# Patient Record
Sex: Male | Born: 1965 | Race: Black or African American | Hispanic: No | Marital: Single | Smoking: Never smoker
Health system: Southern US, Community
[De-identification: ages and names within clinical notes are randomized; demographics above are authoritative.]

## PROBLEM LIST (undated history)

## (undated) DIAGNOSIS — J45909 Unspecified asthma, uncomplicated: Secondary | ICD-10-CM

## (undated) DIAGNOSIS — I10 Essential (primary) hypertension: Secondary | ICD-10-CM

---

## 2004-04-09 ENCOUNTER — Emergency Department: Payer: Self-pay | Admitting: Emergency Medicine

## 2005-01-15 ENCOUNTER — Emergency Department: Payer: Self-pay | Admitting: Emergency Medicine

## 2005-03-25 ENCOUNTER — Other Ambulatory Visit: Payer: Self-pay

## 2005-03-25 ENCOUNTER — Emergency Department: Payer: Self-pay | Admitting: Emergency Medicine

## 2005-09-25 ENCOUNTER — Emergency Department: Payer: Self-pay | Admitting: Emergency Medicine

## 2006-10-12 ENCOUNTER — Emergency Department: Payer: Self-pay | Admitting: Emergency Medicine

## 2007-11-05 ENCOUNTER — Emergency Department: Payer: Self-pay | Admitting: Emergency Medicine

## 2008-03-16 ENCOUNTER — Emergency Department: Payer: Self-pay | Admitting: Emergency Medicine

## 2009-10-13 ENCOUNTER — Emergency Department: Payer: Self-pay | Admitting: Emergency Medicine

## 2011-04-23 ENCOUNTER — Emergency Department: Payer: Self-pay | Admitting: *Deleted

## 2011-07-15 ENCOUNTER — Emergency Department: Payer: Self-pay | Admitting: Emergency Medicine

## 2011-07-15 LAB — CK TOTAL AND CKMB (NOT AT ARMC)
CK, Total: 213 U/L (ref 35–232)
CK, Total: 190 U/L (ref 35–232)
CK-MB: 1.9 ng/mL (ref 0.5–3.6)

## 2011-07-15 LAB — CBC
HGB: 14.5 g/dL (ref 13.0–18.0)
MCH: 30.5 pg (ref 26.0–34.0)
MCHC: 34.1 g/dL (ref 32.0–36.0)
Platelet: 158 10*3/uL (ref 150–440)
RDW: 13.5 % (ref 11.5–14.5)

## 2011-07-15 LAB — COMPREHENSIVE METABOLIC PANEL
Alkaline Phosphatase: 52 U/L (ref 50–136)
Bilirubin,Total: 0.4 mg/dL (ref 0.2–1.0)
Chloride: 106 mmol/L (ref 98–107)
Co2: 28 mmol/L (ref 21–32)
Creatinine: 1.21 mg/dL (ref 0.60–1.30)
EGFR (African American): >60
EGFR (Non-African Amer.): >60
Glucose: 92 mg/dL (ref 65–99)
Osmolality: 288 (ref 275–301)
Sodium: 144 mmol/L (ref 136–145)
Total Protein: 7.3 g/dL (ref 6.4–8.2)

## 2011-09-03 ENCOUNTER — Emergency Department: Payer: Self-pay | Admitting: Unknown Physician Specialty

## 2011-09-03 LAB — TROPONIN I: Troponin-I: <0.02 ng/mL

## 2011-09-03 LAB — CBC
HGB: 14.9 g/dL (ref 13.0–18.0)
MCH: 30 pg (ref 26.0–34.0)
MCV: 90 fL (ref 80–100)
Platelet: 143 10*3/uL — ABNORMAL LOW (ref 150–440)
RBC: 4.96 10*6/uL (ref 4.40–5.90)
RDW: 13.5 % (ref 11.5–14.5)
WBC: 8.1 10*3/uL (ref 3.8–10.6)

## 2011-09-03 LAB — COMPREHENSIVE METABOLIC PANEL
Albumin: 3.8 g/dL (ref 3.4–5.0)
Alkaline Phosphatase: 55 U/L (ref 50–136)
Calcium, Total: 8.7 mg/dL (ref 8.5–10.1)
Co2: 31 mmol/L (ref 21–32)
EGFR (Non-African Amer.): >60
Glucose: 81 mg/dL (ref 65–99)
Potassium: 3.6 mmol/L (ref 3.5–5.1)
SGOT(AST): 29 U/L (ref 15–37)
Total Protein: 7.7 g/dL (ref 6.4–8.2)

## 2012-01-11 ENCOUNTER — Ambulatory Visit: Payer: Self-pay | Admitting: Family Medicine

## 2012-01-15 ENCOUNTER — Ambulatory Visit: Payer: Self-pay | Admitting: Emergency Medicine

## 2012-01-23 ENCOUNTER — Encounter: Payer: Self-pay | Admitting: Emergency Medicine

## 2012-01-25 ENCOUNTER — Ambulatory Visit: Payer: Self-pay

## 2012-02-01 ENCOUNTER — Encounter: Payer: Self-pay | Admitting: Emergency Medicine

## 2012-03-28 ENCOUNTER — Emergency Department: Payer: Self-pay | Admitting: Emergency Medicine

## 2012-12-06 ENCOUNTER — Emergency Department: Payer: Self-pay | Admitting: Emergency Medicine

## 2012-12-06 LAB — CBC
HGB: 14.5 g/dL (ref 13.0–18.0)
MCH: 30.3 pg (ref 26.0–34.0)
MCHC: 34.5 g/dL (ref 32.0–36.0)
MCV: 88 fL (ref 80–100)
RDW: 13.3 % (ref 11.5–14.5)

## 2012-12-06 LAB — COMPREHENSIVE METABOLIC PANEL
Albumin: 3.8 g/dL (ref 3.4–5.0)
Alkaline Phosphatase: 66 U/L (ref 50–136)
Anion Gap: 5 — ABNORMAL LOW (ref 7–16)
BUN: 13 mg/dL (ref 7–18)
Bilirubin,Total: 0.5 mg/dL (ref 0.2–1.0)
Chloride: 107 mmol/L (ref 98–107)
Co2: 30 mmol/L (ref 21–32)
Creatinine: 1.33 mg/dL — ABNORMAL HIGH (ref 0.60–1.30)
EGFR (Non-African Amer.): >60
Osmolality: 283 (ref 275–301)
SGOT(AST): 31 U/L (ref 15–37)
SGPT (ALT): 31 U/L (ref 12–78)
Sodium: 142 mmol/L (ref 136–145)
Total Protein: 7.4 g/dL (ref 6.4–8.2)

## 2013-10-23 ENCOUNTER — Emergency Department: Payer: Self-pay | Admitting: Emergency Medicine

## 2013-10-23 LAB — HEPATIC FUNCTION PANEL A (ARMC)
ALK PHOS: 69 U/L
ALT: 32 U/L (ref 12–78)
AST: 29 U/L (ref 15–37)
Albumin: 3.8 g/dL (ref 3.4–5.0)
Bilirubin, Direct: 0.1 mg/dL (ref 0.00–0.20)
Bilirubin,Total: 0.4 mg/dL (ref 0.2–1.0)
Total Protein: 7.9 g/dL (ref 6.4–8.2)

## 2013-10-23 LAB — BASIC METABOLIC PANEL
ANION GAP: 3 — AB (ref 7–16)
BUN: 16 mg/dL (ref 7–18)
CO2: 33 mmol/L — AB (ref 21–32)
CREATININE: 1.31 mg/dL — AB (ref 0.60–1.30)
Calcium, Total: 8.5 mg/dL (ref 8.5–10.1)
Chloride: 100 mmol/L (ref 98–107)
EGFR (African American): >60
GLUCOSE: 88 mg/dL (ref 65–99)
OSMOLALITY: 273 (ref 275–301)
Potassium: 3.3 mmol/L — ABNORMAL LOW (ref 3.5–5.1)
Sodium: 136 mmol/L (ref 136–145)

## 2013-10-23 LAB — CBC
HCT: 46.4 % (ref 40.0–52.0)
HGB: 15 g/dL (ref 13.0–18.0)
MCH: 28.8 pg (ref 26.0–34.0)
MCHC: 32.3 g/dL (ref 32.0–36.0)
MCV: 89 fL (ref 80–100)
Platelet: 153 10*3/uL (ref 150–440)
RBC: 5.2 10*6/uL (ref 4.40–5.90)
RDW: 13.3 % (ref 11.5–14.5)
WBC: 4.2 10*3/uL (ref 3.8–10.6)

## 2013-10-23 LAB — TROPONIN I: Troponin-I: <0.02 ng/mL

## 2013-10-23 LAB — LIPASE, BLOOD: Lipase: 74 U/L (ref 73–393)

## 2014-06-05 IMAGING — CR DG CHEST 2V
1 series · 2 of 2 positions shown · non-contrast
Comparison: none

REASON FOR EXAM: chest pain
COMMENTS:

PROCEDURE:     MDR - MDR CHEST PA(OR AP) AND LATERAL  - January 11, 2012  [DATE]
RESULT:     Comparison: 09/03/2011

[Series 1: pa · 0.17mm/px · 2 of 2 slices shown]
[im 1/2]
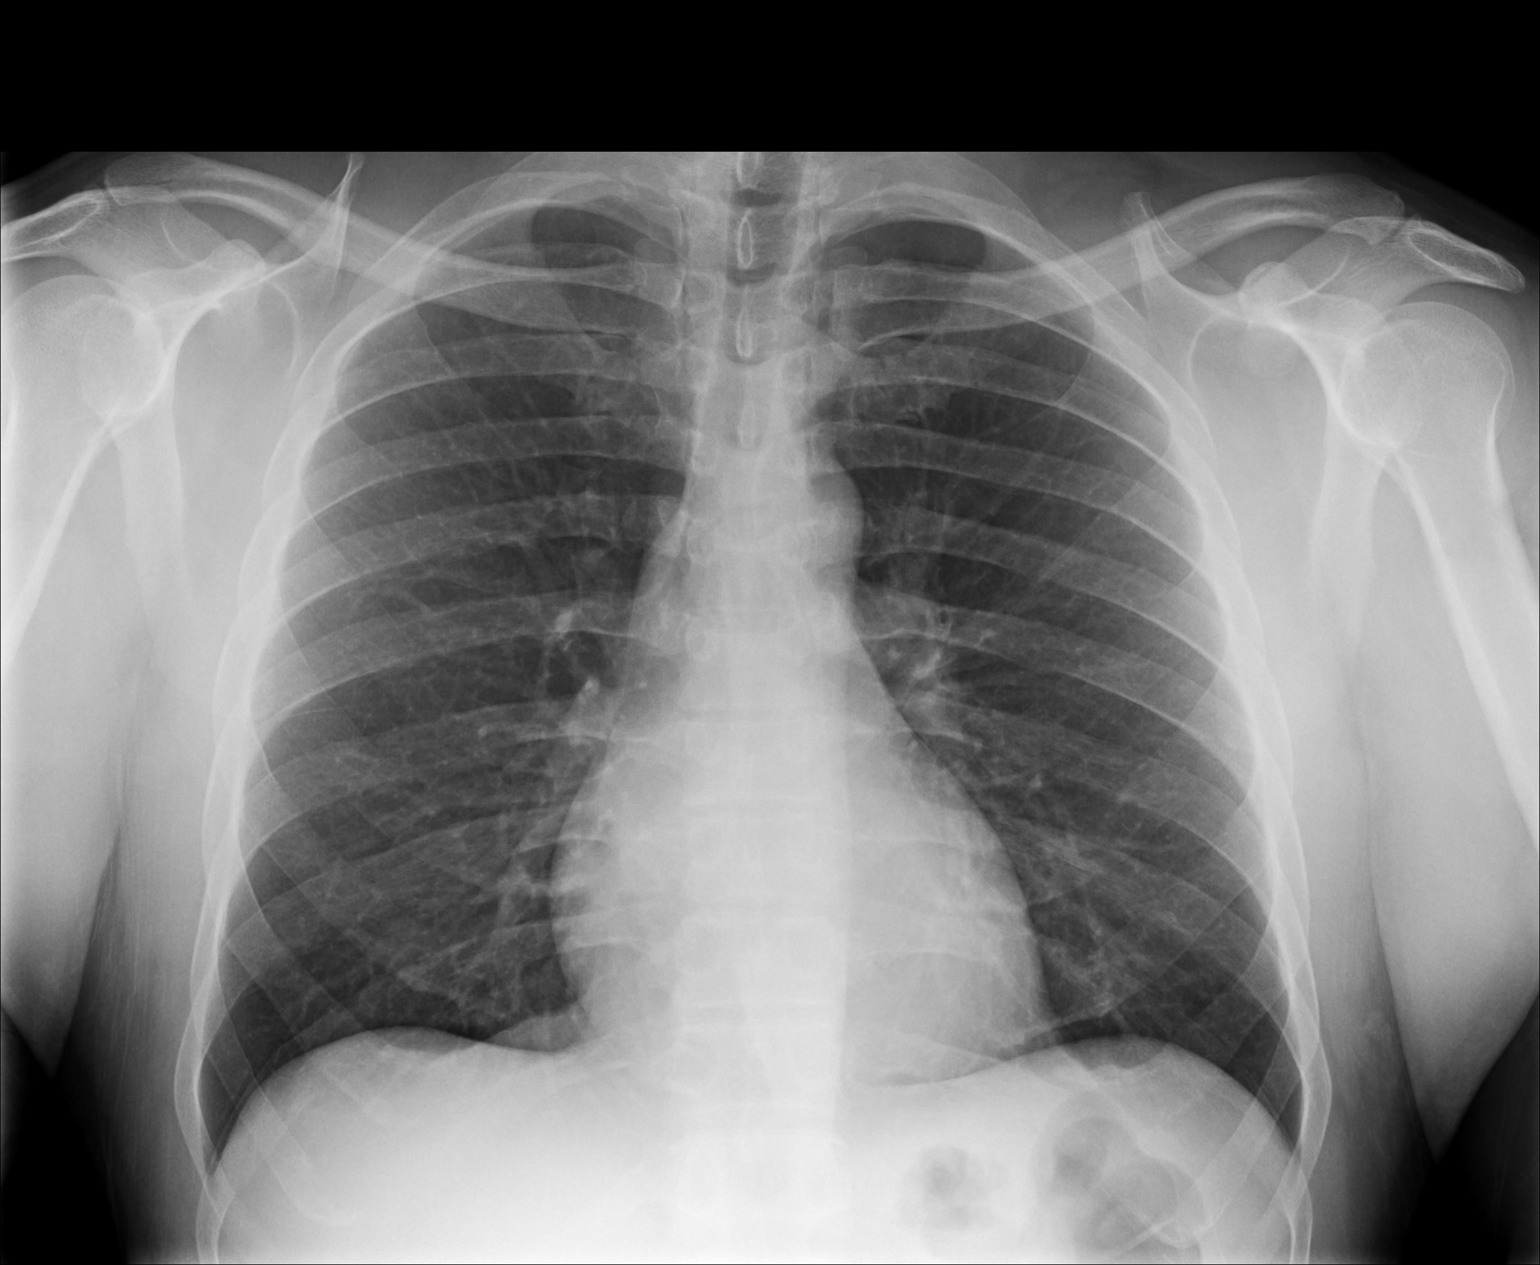
[im 2/2]
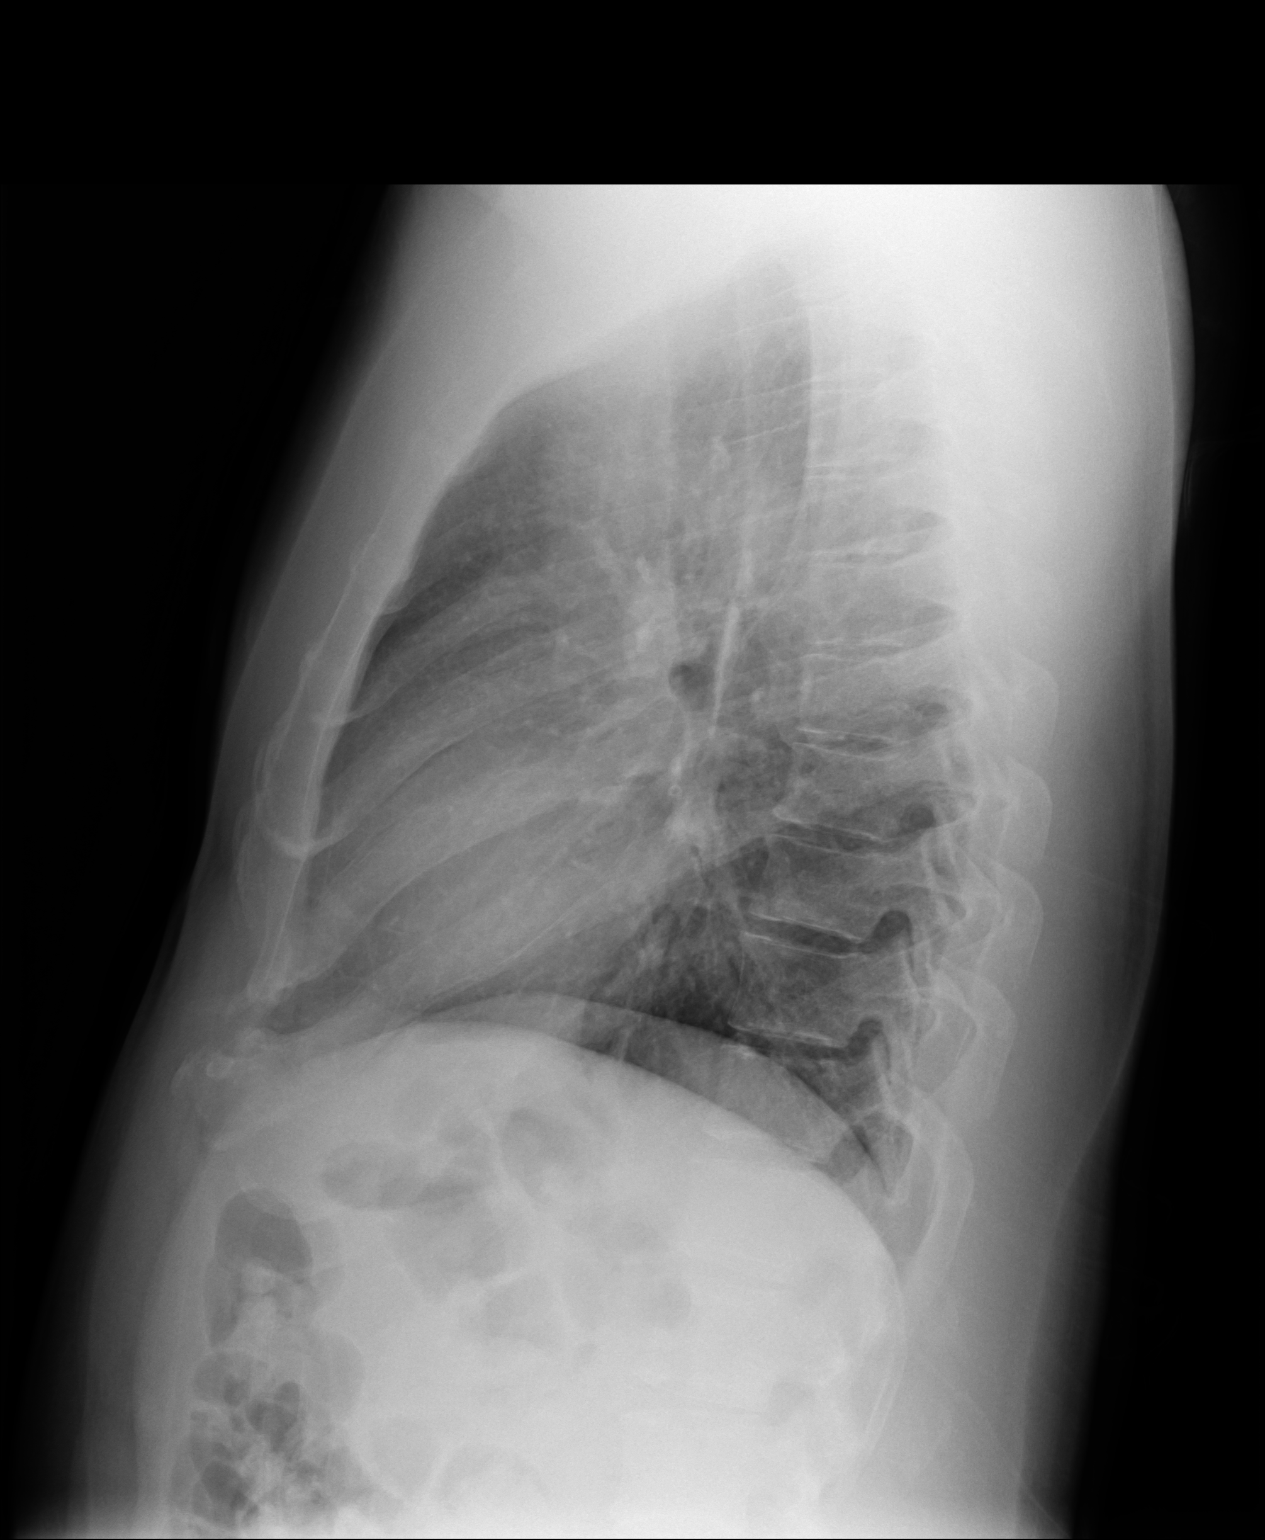

[2 of 2 positions shown; findings below may reference images not displayed]

FINDINGS: The heart and mediastinum are within normal limits. No focal pulmonary
opacities.
IMPRESSION: No acute cardiopulmonary disease.

## 2014-06-05 IMAGING — CR CERVICAL SPINE - COMPLETE 4+ VIEW
1 series · 7 of 7 positions shown · non-contrast
Comparison: none

REASON FOR EXAM: pain
COMMENTS:

PROCEDURE:     MDR - MDR CERVICAL SPINE COMPLETE  - January 11, 2012  [DATE]
RESULT:     Comparison: None.

[Series 1: lat · 0.17mm/px · 7 of 7 slices shown]
[im 1/7]
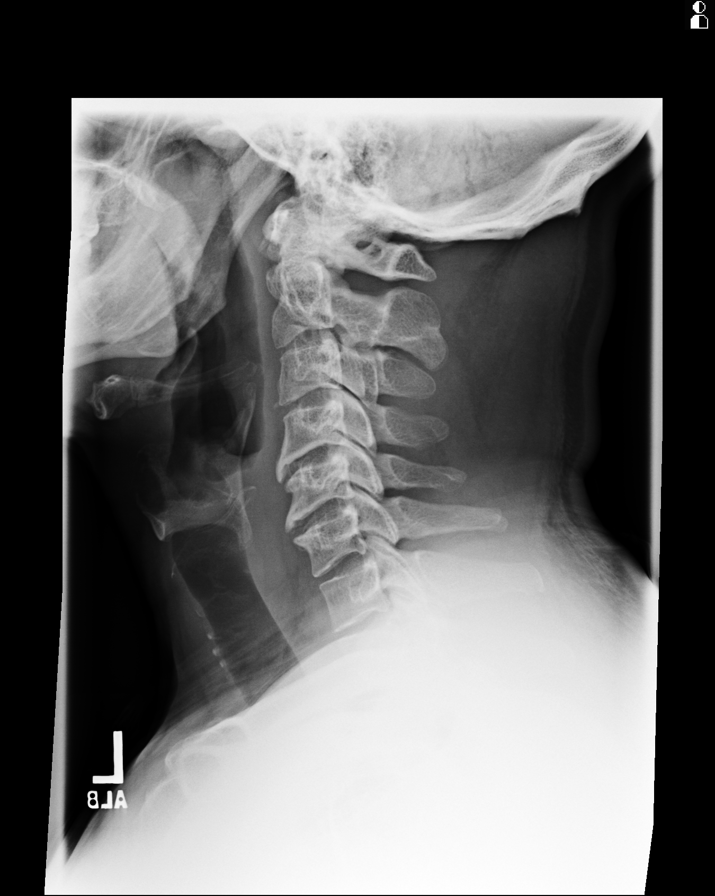
[im 2/7]
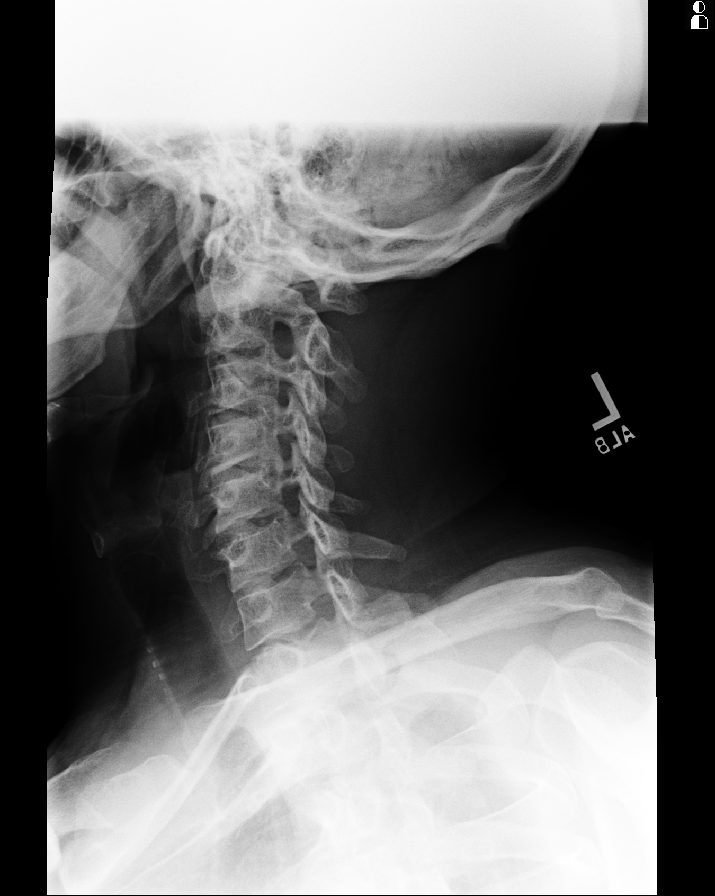
[im 3/7]
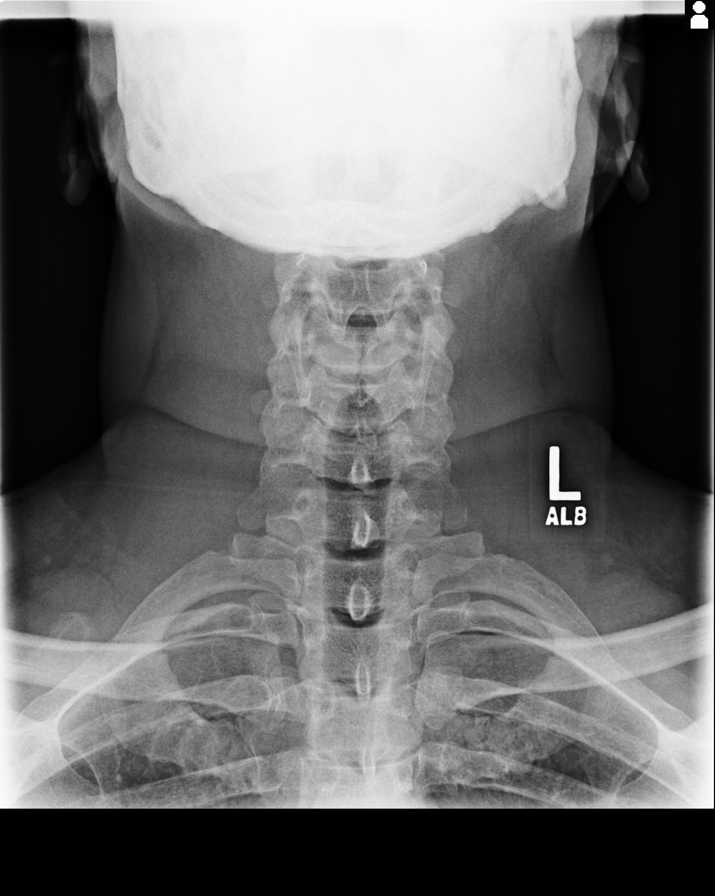
[im 4/7]
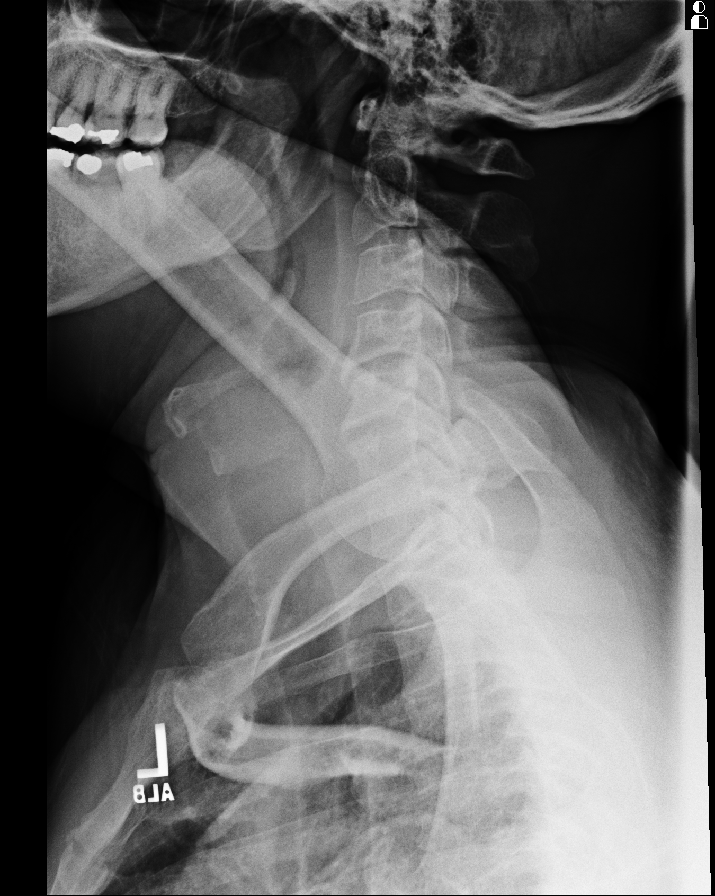
[im 5/7]
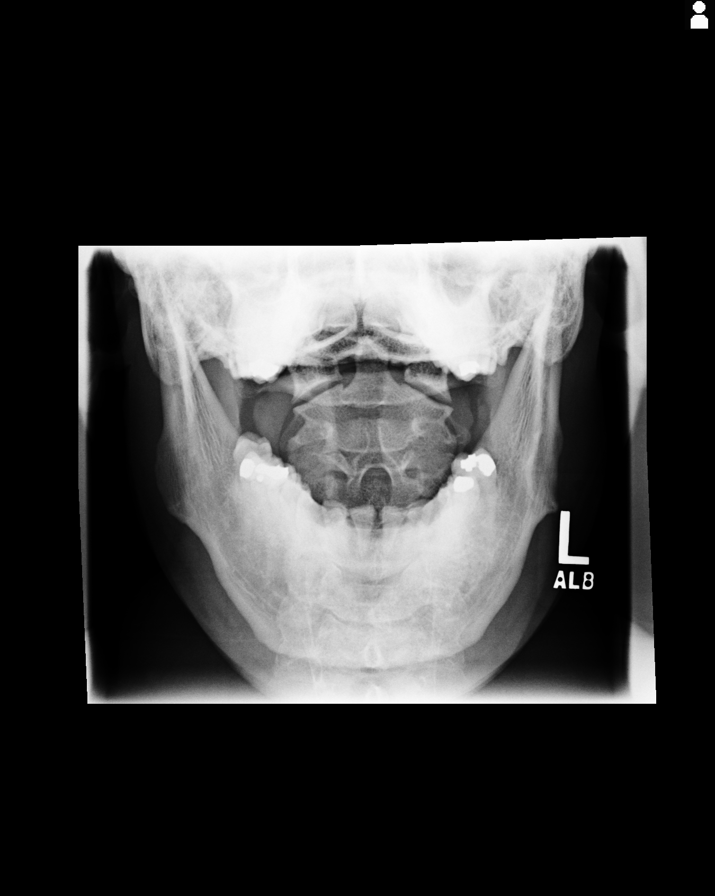
[im 6/7]
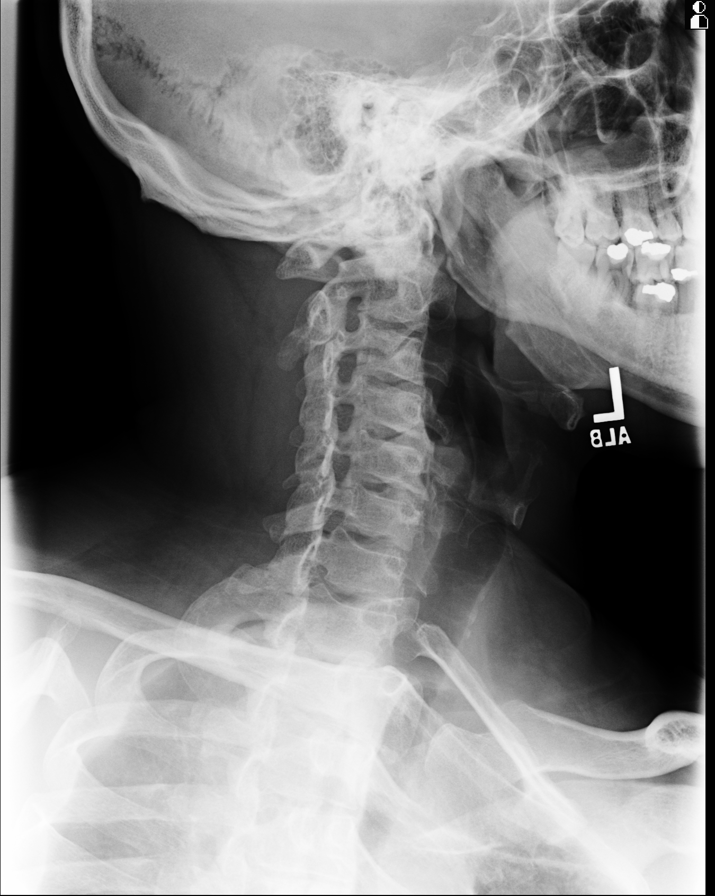
[im 7/7]
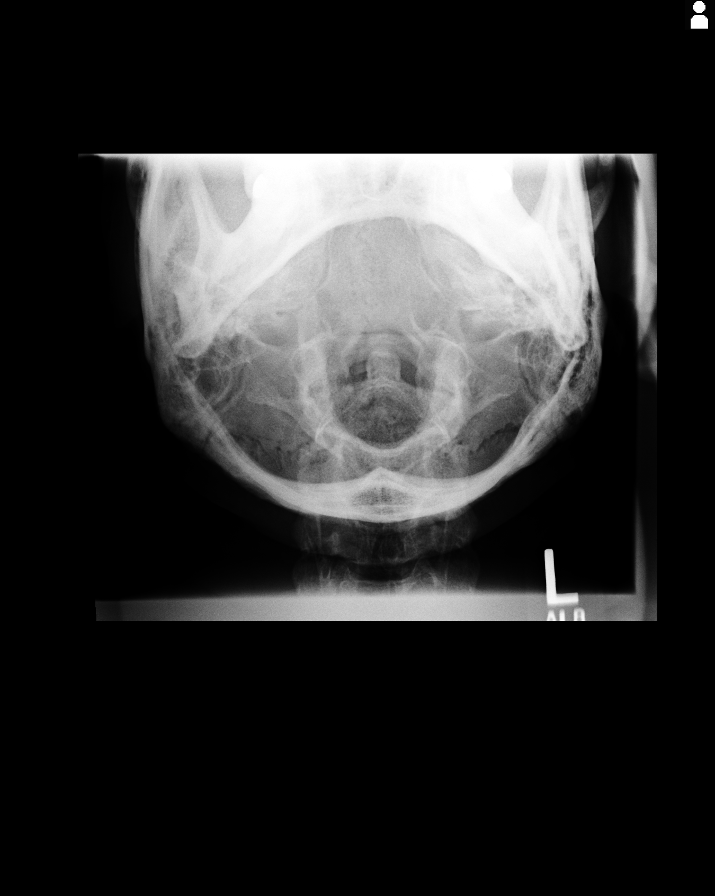

[7 of 7 positions shown; findings below may reference images not displayed]

FINDINGS: The cervical spine is imaged from the skull base through C7. There is mild
intervertebral disc height loss and osteophytosis of C4-C5 and C5-C6. There
is normal alignment. Prevertebral soft tissues are within normal limits. The
neuroforamina are grossly patent.
IMPRESSION: 1. No radiograph evidence of cervical spine fracture. If there is continued
clinical concern for occult cervical spine fracture, further evaluation with
CT is recommended.
2. Mild degenerative disc disease.

## 2014-08-25 ENCOUNTER — Emergency Department: Payer: Self-pay | Admitting: Internal Medicine

## 2014-12-06 ENCOUNTER — Encounter: Payer: Self-pay | Admitting: Emergency Medicine

## 2014-12-06 ENCOUNTER — Emergency Department: Payer: Self-pay

## 2014-12-06 ENCOUNTER — Other Ambulatory Visit: Payer: Self-pay

## 2014-12-06 DIAGNOSIS — R0789 Other chest pain: Secondary | ICD-10-CM | POA: Insufficient documentation

## 2014-12-06 LAB — BASIC METABOLIC PANEL
Anion gap: 8 (ref 5–15)
BUN: 16 mg/dL (ref 6–20)
CO2: 31 mmol/L (ref 22–32)
CREATININE: 1.52 mg/dL — AB (ref 0.61–1.24)
Calcium: 8.8 mg/dL — ABNORMAL LOW (ref 8.9–10.3)
Chloride: 102 mmol/L (ref 101–111)
GFR, EST NON AFRICAN AMERICAN: 53 mL/min — AB (ref >60)
Glucose, Bld: 102 mg/dL — ABNORMAL HIGH (ref 65–99)
Potassium: 3.8 mmol/L (ref 3.5–5.1)
SODIUM: 141 mmol/L (ref 135–145)

## 2014-12-06 LAB — CBC
HCT: 43.8 % (ref 40.0–52.0)
Hemoglobin: 14.6 g/dL (ref 13.0–18.0)
MCH: 30 pg (ref 26.0–34.0)
MCHC: 33.4 g/dL (ref 32.0–36.0)
MCV: 89.6 fL (ref 80.0–100.0)
Platelets: 163 10*3/uL (ref 150–440)
RBC: 4.89 MIL/uL (ref 4.40–5.90)
RDW: 13.1 % (ref 11.5–14.5)
WBC: 6.8 10*3/uL (ref 3.8–10.6)

## 2014-12-06 LAB — TROPONIN I

## 2014-12-06 NOTE — ED Notes (Signed)
Patient with complaint of left side chest pain radiating down left arm times one week. Patient reports pain when taking a deep breath.

## 2014-12-07 ENCOUNTER — Emergency Department
Admission: EM | Admit: 2014-12-07 | Discharge: 2014-12-07 | Discharge status: Against Medical Advice | Payer: Self-pay | Attending: Emergency Medicine | Admitting: Emergency Medicine

## 2015-05-24 ENCOUNTER — Emergency Department
Admission: EM | Admit: 2015-05-24 | Discharge: 2015-05-24 | Disposition: A | Discharge status: Home / Self Care | Payer: No Typology Code available for payment source | Attending: Emergency Medicine | Admitting: Emergency Medicine

## 2015-05-24 DIAGNOSIS — Y9389 Activity, other specified: Secondary | ICD-10-CM | POA: Diagnosis not present

## 2015-05-24 DIAGNOSIS — Y998 Other external cause status: Secondary | ICD-10-CM | POA: Insufficient documentation

## 2015-05-24 DIAGNOSIS — S199XXA Unspecified injury of neck, initial encounter: Secondary | ICD-10-CM | POA: Insufficient documentation

## 2015-05-24 DIAGNOSIS — S3992XA Unspecified injury of lower back, initial encounter: Secondary | ICD-10-CM | POA: Insufficient documentation

## 2015-05-24 DIAGNOSIS — T148XXA Other injury of unspecified body region, initial encounter: Secondary | ICD-10-CM

## 2015-05-24 DIAGNOSIS — Y92410 Unspecified street and highway as the place of occurrence of the external cause: Secondary | ICD-10-CM | POA: Insufficient documentation

## 2015-05-24 DIAGNOSIS — T148 Other injury of unspecified body region: Secondary | ICD-10-CM | POA: Diagnosis not present

## 2015-05-24 HISTORY — DX: Unspecified asthma, uncomplicated: J45.909

## 2015-05-24 MED ORDER — CYCLOBENZAPRINE HCL 5 MG PO TABS
5.0000 mg | ORAL_TABLET | Freq: Three times a day (TID) | ORAL | Status: DC | PRN
Start: 2015-05-24 — End: 2015-06-25
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
## 2015-06-01 ENCOUNTER — Emergency Department: Payer: Self-pay

## 2015-06-01 ENCOUNTER — Emergency Department
Admission: EM | Admit: 2015-06-01 | Discharge: 2015-06-01 | Disposition: A | Discharge status: Home / Self Care | Payer: Self-pay | Attending: Emergency Medicine | Admitting: Emergency Medicine

## 2015-06-01 ENCOUNTER — Encounter: Payer: Self-pay | Admitting: Urgent Care

## 2015-06-01 DIAGNOSIS — J4521 Mild intermittent asthma with (acute) exacerbation: Secondary | ICD-10-CM | POA: Insufficient documentation

## 2015-06-01 DIAGNOSIS — R51 Headache: Secondary | ICD-10-CM | POA: Insufficient documentation

## 2015-06-01 MED ORDER — PREDNISONE 10 MG PO TABS: 50.0000 mg | ORAL_TABLET | Freq: Every day | ORAL | Status: DC

## 2015-06-01 MED ORDER — IPRATROPIUM-ALBUTEROL 0.5-2.5 (3) MG/3ML IN SOLN
3.0000 mL | Freq: Once | RESPIRATORY_TRACT | Status: AC
  Administered 2015-06-01: 3 mL via RESPIRATORY_TRACT
  Filled 2015-06-01: qty 3

## 2015-06-01 MED ORDER — PREDNISONE 20 MG PO TABS
60.0000 mg | ORAL_TABLET | Freq: Once | ORAL | Status: AC
  Administered 2015-06-01: 60 mg via ORAL
  Filled 2015-06-01: qty 3

## 2015-06-01 MED ORDER — ALBUTEROL SULFATE HFA 108 (90 BASE) MCG/ACT IN AERS: 2.0000 | INHALATION_SPRAY | Freq: Four times a day (QID) | RESPIRATORY_TRACT | Status: DC | PRN

## 2015-06-01 NOTE — ED Notes (Signed)
Pt seen and assessed by provider seen providers note for full details.

## 2015-06-01 NOTE — Discharge Instructions (Signed)
Asthma Attack Prevention While you may not be able to control the fact that you have asthma, you can take actions to prevent asthma attacks. The best way to prevent asthma attacks is to maintain good control of your asthma. You can achieve this by:  Taking your medicines as directed.  Avoiding things that can irritate your airways or make your asthma symptoms worse (asthma triggers).  Keeping track of how well your asthma is controlled and of any changes in your symptoms.  Responding quickly to worsening asthma symptoms (asthma attack).  Seeking emergency care when it is needed. WHAT ARE SOME WAYS TO PREVENT AN ASTHMA ATTACK? Have a Plan Work with your health care provider to create a written plan for managing and treating your asthma attacks (asthma action plan). This plan includes:  A list of your asthma triggers and how you can avoid them.  Information on when medicines should be taken and when their dosages should be changed.  The use of a device that measures how well your lungs are working (peak flow meter). Monitor Your Asthma Use your peak flow meter and record your results in a journal every day. A drop in your peak flow numbers on one or more days may indicate the start of an asthma attack. This can happen even before you start to feel symptoms. You can prevent an asthma attack from getting worse by following the steps in your asthma action plan. Avoid Asthma Triggers Work with your asthma health care provider to find out what your asthma triggers are. This can be done by:  Allergy testing.  Keeping a journal that notes when asthma attacks occur and the factors that may have contributed to them.  Determining if there are other medical conditions that are making your asthma worse. Once you have determined your asthma triggers, take steps to avoid them. This may include avoiding excessive or prolonged exposure to:  Dust. Have someone dust and vacuum your home for you once or  twice a week. Using a high-efficiency particulate arrestance (HEPA) vacuum is best.  Smoke. This includes campfire smoke, forest fire smoke, and secondhand smoke from tobacco products.  Pet dander. Avoid contact with animals that you know you are allergic to.  Allergens from trees, grasses or pollens. Avoid spending a lot of time outdoors when pollen counts are high, and on very windy days.  Very cold, dry, or humid air.  Mold.  Foods that contain high amounts of sulfites.  Strong odors.  Outdoor air pollutants, such as engine exhaust.  Indoor air pollutants, such as aerosol sprays and fumes from household cleaners.  Household pests, including dust mites and cockroaches, and pest droppings.  Certain medicines, including NSAIDs. Always talk to your health care provider before stopping or starting any new medicines. Medicines Take over-the-counter and prescription medicines only as told by your health care provider. Many asthma attacks can be prevented by carefully following your medicine schedule. Taking your medicines correctly is especially important when you cannot avoid certain asthma triggers. Act Quickly If an asthma attack does happen, acting quickly can decrease how severe it is and how long it lasts. Take these steps:   Pay attention to your symptoms. If you are coughing, wheezing, or having difficulty breathing, do not wait to see if your symptoms go away on their own. Follow your asthma action plan.  If you have followed your asthma action plan and your symptoms are not improving, call your health care provider or seek immediate medical care   at the nearest hospital. It is important to note how often you need to use your fast-acting rescue inhaler. If you are using your rescue inhaler more often, it may mean that your asthma is not under control. Adjusting your asthma treatment plan may help you to prevent future asthma attacks and help you to gain better control of your  condition. HOW CAN I PREVENT AN ASTHMA ATTACK WHEN I EXERCISE? Follow advice from your health care provider about whether you should use your fast-acting inhaler before exercising. Many people with asthma experience exercise-induced bronchoconstriction (EIB). This condition often worsens during vigorous exercise in cold, humid, or dry environments. Usually, people with EIB can stay very active by pre-treating with a fast-acting inhaler before exercising.   This information is not intended to replace advice given to you by your health care provider. Make sure you discuss any questions you have with your health care provider.   Document Released: 06/07/2009 Document Revised: 03/10/2015 Document Reviewed: 11/19/2014 Elsevier Interactive Patient Education 2016 Elsevier Inc[employer]  

## 2015-06-01 NOTE — ED Notes (Addendum)
Patient present with reports of dyspnea. PMH significant for asthma. Patient reports that he was "cleaning up mold with Clorox" tonight just prior to him having trouble breathing. Patient has used MDI (Aubulterol) x 2 times PTA. Patient in NAD - able to speak in complete sentences without difficulty. Patient eating chips and drinking soda in triage.

## 2015-06-02 NOTE — ED Provider Notes (Signed)
[facility] Emergency Department Provider Note  ____________________________________________  Time seen: Approximately 10:35 PM  I have reviewed the triage vital signs and the nursing notes.   HISTORY  Chief Complaint Asthma   HPI Dave Vasquez is a 49 y.o. male presents to the emergency department for evaluation of asthma exacerbation. He states he's been cleaning his grandmother's house today and has been using a bleach cleaner without wearing a mask. This has triggered his asthma and has not been relieved by albuterol inhaler.   Past Medical History  Diagnosis Date  . Asthma     There are no active problems to display for this patient.   History reviewed. No pertinent past surgical history.  Current Outpatient Rx  Name  Route  Sig  Dispense  Refill  . albuterol (PROVENTIL HFA;VENTOLIN HFA) 108 (90 BASE) MCG/ACT inhaler   Inhalation   Inhale 2 puffs into the lungs every 6 (six) hours as needed for wheezing or shortness of breath.   1 Inhaler   2   . cyclobenzaprine (FLEXERIL) 5 MG tablet   Oral   Take 1 tablet (5 mg total) by mouth every 8 (eight) hours as needed (muscle pain).   10 tablet   1   . predniSONE (DELTASONE) 10 MG tablet   Oral   Take 5 tablets (50 mg total) by mouth daily.   25 tablet   0     Allergies Review of patient's allergies indicates no known allergies.  No family history on file.  Social History Social History  Substance Use Topics  . Smoking status: Never Smoker   . Smokeless tobacco: None  . Alcohol Use: No    Review of Systems Constitutional: No fever/chills Eyes: No visual changes. ENT: No sore throat. Cardiovascular: Denies chest pain. Respiratory: Mild shortness of breath. Positive for cough. Gastrointestinal: Negative for abdominal pain. They did for nausea,  negative for vomiting.  Negative for diarrhea.  Genitourinary: Negative for dysuria. Musculoskeletal: Negative for body aches Skin:  Negative for rash. Neurological: Positive for headaches, Negative for focal weakness or numbness.  10-point ROS otherwise negative.  ____________________________________________   PHYSICAL EXAM:  VITAL SIGNS: ED Triage Vitals  Enc Vitals Group     BP 06/01/15 2222 164/95 mmHg     Pulse --      Resp 06/01/15 2222 20     Temp 06/01/15 2222 97.5 F (36.4 C)     Temp Source 06/01/15 2222 Oral     SpO2 06/01/15 2222 98 %     Weight 06/01/15 2222 176 lb (79.833 kg)     Height 06/01/15 2222 5\' 5"  (1.651 m)     Head Cir --      Peak Flow --      Pain Score 06/01/15 2223 0     Pain Loc --      Pain Edu? --      Excl. in [place]? --     Constitutional: Alert and oriented. Well appearing and in no acute distress. Eyes: Conjunctivae are normal. PERRL. EOMI. Head: Atraumatic. Nose: No congestion/rhinnorhea. Mouth/Throat: Mucous membranes are moist and mildly erythematous.  Oropharynx non-erythematous. Neck: No stridor.  Lymphatic: No cervical lymphadenopathy. Cardiovascular: Normal rate, regular rhythm. Grossly normal heart sounds.  Good peripheral circulation. Respiratory: Normal respiratory effort.  No retractions. Lungs diminished throughout. Gastrointestinal: Soft and nontender. No distention. No abdominal bruits. No CVA tenderness. Musculoskeletal: No joint pain reported. Neurologic:  Normal speech and language. No gross focal neurologic deficits  are appreciated. Speech is normal. No gait instability. Skin:  Skin is warm, dry and intact. No rash noted. Psychiatric: Mood and affect are normal. Speech and behavior are normal.  ____________________________________________   LABS (all labs ordered are listed, but only abnormal results are displayed)  Labs Reviewed - No data to display ____________________________________________  EKG   ____________________________________________  RADIOLOGY  Chest x-ray negative for acute cardiopulmonary  abnormality. ____________________________________________   PROCEDURES  Procedure(s) performed: None  Critical Care performed: No  ____________________________________________   INITIAL IMPRESSION / ASSESSMENT AND PLAN / ED COURSE  Pertinent labs & imaging results that were available during my care of the patient were reviewed by me and considered in my medical decision making (see chart for details).   DuoNeb given in the emergency department with significant relief. Much improved airflow after treatment. He was also given prednisone 60 mg all in the emergency department tonight. He was strongly advised to avoid going back into the house without wearing a mask. He was advised to return to the emergency department for symptoms that change or worsen if he is unable schedule appointment. ____________________________________________   FINAL CLINICAL IMPRESSION(S) / ED DIAGNOSES  Final diagnoses:  Acute asthma exacerbation, mild intermittent       Victorino Dike, FNP 06/02/15 0009  Earleen Newport, MD 06/02/15 [phone]

## 2015-06-25 ENCOUNTER — Encounter: Payer: Self-pay | Admitting: Emergency Medicine

## 2015-06-25 ENCOUNTER — Emergency Department: Payer: No Typology Code available for payment source

## 2015-06-25 ENCOUNTER — Emergency Department
Admission: EM | Admit: 2015-06-25 | Discharge: 2015-06-25 | Disposition: A | Discharge status: Home / Self Care | Payer: No Typology Code available for payment source | Attending: Emergency Medicine | Admitting: Emergency Medicine

## 2015-06-25 DIAGNOSIS — S161XXA Strain of muscle, fascia and tendon at neck level, initial encounter: Secondary | ICD-10-CM | POA: Diagnosis not present

## 2015-06-25 DIAGNOSIS — Y92410 Unspecified street and highway as the place of occurrence of the external cause: Secondary | ICD-10-CM | POA: Insufficient documentation

## 2015-06-25 DIAGNOSIS — Y998 Other external cause status: Secondary | ICD-10-CM | POA: Diagnosis not present

## 2015-06-25 DIAGNOSIS — Z7982 Long term (current) use of aspirin: Secondary | ICD-10-CM | POA: Diagnosis not present

## 2015-06-25 DIAGNOSIS — Y9389 Activity, other specified: Secondary | ICD-10-CM | POA: Insufficient documentation

## 2015-06-25 DIAGNOSIS — S199XXA Unspecified injury of neck, initial encounter: Secondary | ICD-10-CM | POA: Diagnosis present

## 2015-06-25 MED ORDER — IBUPROFEN 800 MG PO TABS: 800.0000 mg | ORAL_TABLET | Freq: Three times a day (TID) | ORAL | Status: DC | PRN

## 2015-06-25 MED ORDER — CYCLOBENZAPRINE HCL 10 MG PO TABS: 10.0000 mg | ORAL_TABLET | Freq: Three times a day (TID) | ORAL | Status: DC | PRN
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
## 2015-09-14 ENCOUNTER — Encounter: Payer: Self-pay | Admitting: Emergency Medicine

## 2015-09-14 ENCOUNTER — Emergency Department: Payer: Self-pay

## 2015-09-14 ENCOUNTER — Emergency Department
Admission: EM | Admit: 2015-09-14 | Discharge: 2015-09-14 | Disposition: A | Discharge status: Home / Self Care | Payer: Self-pay | Attending: Emergency Medicine | Admitting: Emergency Medicine

## 2015-09-14 DIAGNOSIS — Y9289 Other specified places as the place of occurrence of the external cause: Secondary | ICD-10-CM | POA: Insufficient documentation

## 2015-09-14 DIAGNOSIS — Y9389 Activity, other specified: Secondary | ICD-10-CM | POA: Insufficient documentation

## 2015-09-14 DIAGNOSIS — Z7982 Long term (current) use of aspirin: Secondary | ICD-10-CM | POA: Insufficient documentation

## 2015-09-14 DIAGNOSIS — S86812A Strain of other muscle(s) and tendon(s) at lower leg level, left leg, initial encounter: Secondary | ICD-10-CM | POA: Insufficient documentation

## 2015-09-14 DIAGNOSIS — Y998 Other external cause status: Secondary | ICD-10-CM | POA: Insufficient documentation

## 2015-09-14 DIAGNOSIS — W1843XA Slipping, tripping and stumbling without falling due to stepping from one level to another, initial encounter: Secondary | ICD-10-CM | POA: Insufficient documentation

## 2015-09-14 MED ORDER — IBUPROFEN 800 MG PO TABS
800.0000 mg | ORAL_TABLET | Freq: Once | ORAL | Status: AC
  Administered 2015-09-14: 800 mg via ORAL
  Filled 2015-09-14: qty 1

## 2015-09-14 MED ORDER — HYDROCODONE-ACETAMINOPHEN 5-325 MG PO TABS: 1.0000 | ORAL_TABLET | ORAL | Status: DC | PRN

## 2015-09-14 MED ORDER — NAPROXEN 500 MG PO TABS: 500.0000 mg | ORAL_TABLET | Freq: Two times a day (BID) | ORAL | Status: DC

## 2015-09-14 NOTE — Discharge Instructions (Signed)
Muscle Strain A muscle strain (pulled muscle) happens when a muscle is stretched beyond normal length. It happens when a sudden, violent force stretches your muscle too far. Usually, a few of the fibers in your muscle are torn. Muscle strain is common in athletes. Recovery usually takes 1-2 weeks. Complete healing takes 5-6 weeks.  HOME CARE   Follow the PRICE method of treatment to help your injury get better. Do this the first 2-3 days after the injury:  Protect. Protect the muscle to keep it from getting injured again.  Rest. Limit your activity and rest the injured body part.  Ice. Put ice in a plastic bag. Place a towel between your skin and the bag. Then, apply the ice and leave it on from 15-20 minutes each hour. After the third day, switch to moist heat packs.  Compression. Use a splint or elastic bandage on the injured area for comfort. Do not put it on too tightly.  Elevate. Keep the injured body part above the level of your heart.  Only take medicine as told by your doctor.  Warm up before doing exercise to prevent future muscle strains. GET HELP IF:   You have more pain or puffiness (swelling) in the injured area.  You feel numbness, tingling, or notice a loss of strength in the injured area. MAKE SURE YOU:   Understand these instructions.  Will watch your condition.  Will get help right away if you are not doing well or get worse.   This information is not intended to replace advice given to you by your health care provider. Make sure you discuss any questions you have with your health care provider.   Document Released: 03/28/2008 Document Revised: 04/09/2013 Document Reviewed: 01/16/2013 Elsevier Interactive Patient Education 2016 [place] and elevation as needed for pain. Use crutches for walking. Follow-up with Dr. Mack Guise if any continued problems. Begin Norco as needed for pain and naproxen 500 mg twice a day with food

## 2015-09-14 NOTE — ED Notes (Signed)
Says he stepped wrong yesterday and hurt left calf area and it is worse today.

## 2015-09-14 NOTE — ED Notes (Signed)
Pt to ed with c/o left lower leg pain, states he was stepping out of his truck at home yesterday and slipped pulling the back of the leg.  Reports this am increased pain with ambulation.

## 2015-09-14 NOTE — ED Provider Notes (Signed)
[facility] Emergency Department Provider Note  ____________________________________________  Time seen: Approximately 1:48 PM  I have reviewed the triage vital signs and the nursing notes.   HISTORY  Chief Complaint Leg Injury   HPI Dave Vasquez is a 50 y.o. male with complaint of left lower leg pain. Patient states he stepped out of a truck at home yesterday and slipped. He states he did not fall completely to the ground but actually pulled the back of his leg. He states he has not taken any over-the-counter medication for this and this morning when he woke up the pain had increased. He now has increased pain with ambulation and with weightbearing. Denies any previous injury to his leg.He rates his pain as 9/10 at this time.   Past Medical History  Diagnosis Date  . Asthma     There are no active problems to display for this patient.   No past surgical history on file.  Current Outpatient Rx  Name  Route  Sig  Dispense  Refill  . aspirin 81 MG tablet   Oral   Take 81 mg by mouth daily.         . cyclobenzaprine (FLEXERIL) 10 MG tablet   Oral   Take 1 tablet (10 mg total) by mouth every 8 (eight) hours as needed for muscle spasms.   30 tablet   1   . HYDROcodone-acetaminophen (NORCO/VICODIN) 5-325 MG tablet   Oral   Take 1 tablet by mouth every 4 (four) hours as needed for moderate pain.   20 tablet   0   . ibuprofen (ADVIL,MOTRIN) 800 MG tablet   Oral   Take 1 tablet (800 mg total) by mouth every 8 (eight) hours as needed.   30 tablet   0   . naproxen (NAPROSYN) 500 MG tablet   Oral   Take 1 tablet (500 mg total) by mouth 2 (two) times daily with a meal.   30 tablet   0     Allergies Review of patient's allergies indicates no known allergies.  No family history on file.  Social History Social History  Substance Use Topics  . Smoking status: Never Smoker   . Smokeless tobacco: None  . Alcohol Use: No    Review of  Systems Constitutional: No fever/chills Cardiovascular: Denies chest pain. Respiratory: Denies shortness of breath. Gastrointestinal: No nausea, no vomiting.  Musculoskeletal: Positive left lower leg pain. Skin: Negative for rash. Neurological: Negative for focal weakness or numbness.  10-point ROS otherwise negative.  ____________________________________________   PHYSICAL EXAM:  VITAL SIGNS: ED Triage Vitals  Enc Vitals Group     BP 09/14/15 1326 138/82 mmHg     Pulse Rate 09/14/15 1326 73     Resp 09/14/15 1326 14     Temp 09/14/15 1326 98.2 F (36.8 C)     Temp Source 09/14/15 1326 Oral     SpO2 09/14/15 1326 98 %     Weight 09/14/15 1326 183 lb (83.008 kg)     Height 09/14/15 1326 5\' 5"  (1.651 m)     Head Cir --      Peak Flow --      Pain Score 09/14/15 1326 9     Pain Loc --      Pain Edu? --      Excl. in [place]? --     Constitutional: Alert and oriented. Well appearing and in no acute distress. Eyes: Conjunctivae are normal. PERRL. EOMI. Head: Atraumatic. Nose:  No congestion/rhinnorhea. Mouth/Throat: Mucous membranes are moist.  Oropharynx non-erythematous. Neck: No stridor.   Cardiovascular: Normal rate, regular rhythm. Grossly normal heart sounds.  Good peripheral circulation. Respiratory: Normal respiratory effort.  No retractions. Lungs CTAB. Musculoskeletal: Left leg no gross deformity was noted. There is marked tenderness on palpation of the posterior aspect of the left lower leg. There is no edema, erythema or ecchymosis noted. Achilles tendon is nontender to palpation. Thompson sign is negative for Achilles rupture. Patient is able to flex and extend his foot however movement does cause pain in his calf. Neurologic:  Normal speech and language. No gross focal neurologic deficits are appreciated. No gait instability. Skin:  Skin is warm, dry and intact. No rash noted. Psychiatric: Mood and affect are normal. Speech and behavior are  normal.  ____________________________________________   LABS (all labs ordered are listed, but only abnormal results are displayed)  Labs Reviewed - No data to display  RADIOLOGY Left tib-fib per radiologist is negative.  ____________________________________________   PROCEDURES  Procedure(s) performed: None  Critical Care performed: No  ____________________________________________   INITIAL IMPRESSION / ASSESSMENT AND PLAN / ED COURSE  Pertinent labs & imaging results that were available during my care of the patient were reviewed by me and considered in my medical decision making (see chart for details).  Ice and elevate as needed for pain and swelling. Use crutches for walking. Norco as needed for pain and naproxen 500 mg twice a day with food. Follow-up with Dr. Mack Guise if no improvement. ____________________________________________   FINAL CLINICAL IMPRESSION(S) / ED DIAGNOSES  Final diagnoses:  Strain of calf muscle, left, initial encounter      [name], PA-C 09/14/15 1502  Delman Kitten, MD 09/14/15 1521

## 2015-11-15 DIAGNOSIS — J4520 Mild intermittent asthma, uncomplicated: Secondary | ICD-10-CM | POA: Insufficient documentation

## 2015-11-15 DIAGNOSIS — J45909 Unspecified asthma, uncomplicated: Secondary | ICD-10-CM | POA: Insufficient documentation

## 2016-03-24 ENCOUNTER — Emergency Department: Payer: Self-pay

## 2016-03-24 ENCOUNTER — Emergency Department
Admission: EM | Admit: 2016-03-24 | Discharge: 2016-03-24 | Disposition: A | Discharge status: Home / Self Care | Payer: Self-pay | Attending: Emergency Medicine | Admitting: Emergency Medicine

## 2016-03-24 ENCOUNTER — Encounter: Payer: Self-pay | Admitting: Emergency Medicine

## 2016-03-24 DIAGNOSIS — Y999 Unspecified external cause status: Secondary | ICD-10-CM | POA: Insufficient documentation

## 2016-03-24 DIAGNOSIS — M25461 Effusion, right knee: Secondary | ICD-10-CM

## 2016-03-24 DIAGNOSIS — Z791 Long term (current) use of non-steroidal anti-inflammatories (NSAID): Secondary | ICD-10-CM | POA: Insufficient documentation

## 2016-03-24 DIAGNOSIS — X58XXXA Exposure to other specified factors, initial encounter: Secondary | ICD-10-CM | POA: Insufficient documentation

## 2016-03-24 DIAGNOSIS — J45909 Unspecified asthma, uncomplicated: Secondary | ICD-10-CM | POA: Insufficient documentation

## 2016-03-24 DIAGNOSIS — Y929 Unspecified place or not applicable: Secondary | ICD-10-CM | POA: Insufficient documentation

## 2016-03-24 DIAGNOSIS — Y939 Activity, unspecified: Secondary | ICD-10-CM | POA: Insufficient documentation

## 2016-03-24 DIAGNOSIS — Z7982 Long term (current) use of aspirin: Secondary | ICD-10-CM | POA: Insufficient documentation

## 2016-03-24 DIAGNOSIS — S8391XA Sprain of unspecified site of right knee, initial encounter: Secondary | ICD-10-CM | POA: Insufficient documentation

## 2016-03-24 NOTE — ED Notes (Signed)
[  Patient describes sharp pain to the right knee and swelling. Denies injury. Ambulatory

## 2016-03-24 NOTE — ED Triage Notes (Signed)
Reports right knee pain x 1 wk.  Denies injury.  Ambulates well.

## 2016-03-24 NOTE — ED Provider Notes (Signed)
[facility] Emergency Department Provider Note ____________________________________________  Time seen: 1700  I have reviewed the triage vital signs and the nursing notes.  HISTORY  Chief Complaint  Knee Pain  HPI Dave Vasquez is a 50 y.o. male presents to the ED for evaluation of right knee pain over the last week. He ascribes onset on Saturday after he admits to working on his knees to her. Busted waterpipe at home. Since that time he's had increasing pain and tightness to the right knee. He also describes pain that is worse with flexion of the knee. He also describes decreased flexion secondary to the tightness in the joint. He localizes the pain to the anterior and lateral aspects of the kneecap. He has not taken any anti-inflammatories in the interim nor has he applied any ice or a scraps to the knee in the interim. He denies any history of chronic or ongoing knee problems.  Past Medical History:  Diagnosis Date  . Asthma     There are no active problems to display for this patient.   History reviewed. No pertinent surgical history.  Prior to Admission medications   Medication Sig Start Date End Date Taking? Authorizing Provider  aspirin 81 MG tablet Take 81 mg by mouth daily.    Historical Provider, MD  cyclobenzaprine (FLEXERIL) 10 MG tablet Take 1 tablet (10 mg total) by mouth every 8 (eight) hours as needed for muscle spasms. 06/25/15   Arlyss Repress, PA-C  HYDROcodone-acetaminophen (NORCO/VICODIN) 5-325 MG tablet Take 1 tablet by mouth every 4 (four) hours as needed for moderate pain. 09/14/15   [name], PA-C  ibuprofen (ADVIL,MOTRIN) 800 MG tablet Take 1 tablet (800 mg total) by mouth every 8 (eight) hours as needed. 06/25/15   Arlyss Repress, PA-C  naproxen (NAPROSYN) 500 MG tablet Take 1 tablet (500 mg total) by mouth 2 (two) times daily with a meal. 09/14/15   [name], PA-C    Allergies Review of patient's allergies indicates  no known allergies.  No family history on file.  Social History Social History  Substance Use Topics  . Smoking status: Never Smoker  . Smokeless tobacco: Never Used  . Alcohol use No    Review of Systems  Constitutional: Negative for fever. Musculoskeletal: Negative for back pain. Right knee pain and swelling as above. Skin: Negative for rash. Neurological: Negative for headaches, focal weakness or numbness. ____________________________________________  PHYSICAL EXAM:  VITAL SIGNS: ED Triage Vitals  Enc Vitals Group     BP 03/24/16 1642 (!) 143/85     Pulse Rate 03/24/16 1642 73     Resp 03/24/16 1642 18     Temp 03/24/16 1642 98.6 F (37 C)     Temp Source 03/24/16 1642 Oral     SpO2 03/24/16 1642 98 %     Weight 03/24/16 1643 185 lb (83.9 kg)     Height 03/24/16 1643 5\' 5"  (1.651 m)     Head Circumference --      Peak Flow --      Pain Score --      Pain Loc --      Pain Edu? --      Excl. in [place]? --    Constitutional: Alert and oriented. Well appearing and in no distress. Head: Normocephalic and atraumatic. Cardiovascular: Normal Distal pulses.  Respiratory: Normal respiratory effort.  Musculoskeletal: Right knee with subtle effusion noted. There is no dislocation, deformity, or erythema appreciated. Patella tracks  normally with flexion and extension is no patella laxity. No valgus or varus joint stress is appreciated. Negative anterior posterior drawer exam. Patient with normal flexion and extension range of motion on exam. There is no popliteal space fullness or calf or Achilles tenderness distally. Nontender with normal range of motion in all extremities.  Neurologic:  Normal gait without ataxia. Normal speech and language. No gross focal neurologic deficits are appreciated. Skin:  Skin is warm, dry and intact. No rash noted. ____________________________________________  PROCEDURES  Ace wrap ____________________________________________  INITIAL IMPRESSION  / ASSESSMENT AND PLAN / ED COURSE  Patient with a right knee sprain with a mild effusion on exam. No indication of any internal derangement on his presentation. He is fitted with an Ace wrap for support and advised to dose over-the-counter ibuprofen for pain relief. He is referred to orthopedics for any ongoing symptom management.  Clinical Course   ____________________________________________  FINAL CLINICAL IMPRESSION(S) / ED DIAGNOSES  Final diagnoses:  Right knee sprain, initial encounter  Knee effusion, right      Melvenia Needles, PA-C 03/24/16 1859    Orbie Pyo, MD 03/25/16 [phone]

## 2016-03-24 NOTE — Discharge Instructions (Signed)
Wear the knee wrap as needed for support. Apply ice to reduce pain and swelling. Take OTC ibuprofen 400 mg per dose for pain and inflammation. Avoid working directly on the knees without the use of visco-elastic knee pads. Follow-up with Dr. Rudene Christians for continued symptoms.

## 2016-03-31 ENCOUNTER — Emergency Department
Admission: EM | Admit: 2016-03-31 | Discharge: 2016-03-31 | Disposition: A | Discharge status: Home / Self Care | Payer: Self-pay | Attending: Emergency Medicine | Admitting: Emergency Medicine

## 2016-03-31 ENCOUNTER — Encounter: Payer: Self-pay | Admitting: Emergency Medicine

## 2016-03-31 ENCOUNTER — Emergency Department: Payer: Self-pay

## 2016-03-31 DIAGNOSIS — Z791 Long term (current) use of non-steroidal anti-inflammatories (NSAID): Secondary | ICD-10-CM | POA: Insufficient documentation

## 2016-03-31 DIAGNOSIS — M25461 Effusion, right knee: Secondary | ICD-10-CM | POA: Insufficient documentation

## 2016-03-31 DIAGNOSIS — J45909 Unspecified asthma, uncomplicated: Secondary | ICD-10-CM | POA: Insufficient documentation

## 2016-03-31 DIAGNOSIS — Z7982 Long term (current) use of aspirin: Secondary | ICD-10-CM | POA: Insufficient documentation

## 2016-03-31 DIAGNOSIS — M25561 Pain in right knee: Secondary | ICD-10-CM

## 2016-03-31 MED ORDER — HYDROCODONE-ACETAMINOPHEN 5-325 MG PO TABS: 1.0000 | ORAL_TABLET | ORAL | 0 refills | Status: DC | PRN

## 2016-03-31 NOTE — ED Triage Notes (Signed)
C/O left knee pain. States seen in ED last Thursday for same.  Discharged home with ace wrap and told to take ibuprofen. Patient states initially pain improved, but yesterday pain returned.  Has not taken any thing for pain today.

## 2016-03-31 NOTE — Discharge Instructions (Signed)
Wear knee immobilizer anytime your up walking. You do not have to sleep while wearing the immobilizer. Call and make an appointment with Dr. Sabra Heck next week. Norco as needed for pain. Do not drive while wearing the knee immobilizer or take medication while driving.

## 2016-03-31 NOTE — ED Provider Notes (Signed)
[facility] Emergency Department Provider Note  ____________________________________________   First MD Initiated Contact with Patient 03/31/16 2037     (approximate)  I have reviewed the triage vital signs and the nursing notes.   HISTORY  Chief Complaint Knee Pain   HPI Dave Vasquez is a 50 y.o. male is here complaint of right knee pain. Patient was in the emergency room one week ago and was seen for pain to the same knee. At that time he gave a history of crawling around on his knees fixing a busted water pipe that was at home. He states he has been taking ibuprofen and using the Ace wrap and it seemed to be getting better. Last evening he states that pain increased. He did not make an appointment with the orthopedist from his referral last weekend as he felt his knee was getting better.He denies any knee injuries prior to this event. Patient continues to be ambulatory but states that there is increased pain with walking and especially if he is trying to kneel. Currently he rates his pain as 8 out of 10.   Past Medical History:  Diagnosis Date  . Asthma     There are no active problems to display for this patient.   History reviewed. No pertinent surgical history.  Prior to Admission medications   Medication Sig Start Date End Date Taking? Authorizing Provider  aspirin 81 MG tablet Take 81 mg by mouth daily.    Historical Provider, MD  cyclobenzaprine (FLEXERIL) 10 MG tablet Take 1 tablet (10 mg total) by mouth every 8 (eight) hours as needed for muscle spasms. 06/25/15   Arlyss Repress, PA-C  HYDROcodone-acetaminophen (NORCO/VICODIN) 5-325 MG tablet Take 1 tablet by mouth every 4 (four) hours as needed for moderate pain. 03/31/16   [name], PA-C  ibuprofen (ADVIL,MOTRIN) 800 MG tablet Take 1 tablet (800 mg total) by mouth every 8 (eight) hours as needed. 06/25/15   Arlyss Repress, PA-C    Allergies Review of patient's allergies  indicates no known allergies.  No family history on file.  Social History Social History  Substance Use Topics  . Smoking status: Never Smoker  . Smokeless tobacco: Never Used  . Alcohol use No    Review of Systems Constitutional: No fever/chills Cardiovascular: Denies chest pain. Respiratory: Denies shortness of breath. Gastrointestinal: No abdominal pain.  No nausea, no vomiting.   Musculoskeletal: Negative for back pain.Positive right knee pain. Skin: Negative for rash. Neurological: Negative for headaches  10-point ROS otherwise negative.  ____________________________________________   PHYSICAL EXAM:  VITAL SIGNS: ED Triage Vitals  Enc Vitals Group     BP 03/31/16 1957 (!) 143/101     Pulse Rate 03/31/16 1957 65     Resp --      Temp 03/31/16 1957 98.2 F (36.8 C)     Temp Source 03/31/16 1957 Oral     SpO2 03/31/16 1957 98 %     Weight 03/31/16 2013 190 lb (86.2 kg)     Height 03/31/16 2013 5\' 5"  (1.651 m)     Head Circumference --      Peak Flow --      Pain Score 03/31/16 2013 8     Pain Loc --      Pain Edu? --      Excl. in [place]? --     Constitutional: Alert and oriented. Well appearing and in no acute distress. Eyes: Conjunctivae are normal. PERRL. EOMI. Head:  Atraumatic. Nose: No congestion/rhinnorhea. Neck: No stridor.   Cardiovascular: Normal rate, regular rhythm. Grossly normal heart sounds.  Good peripheral circulation. Respiratory: Normal respiratory effort.  No retractions. Lungs CTAB. Gastrointestinal: Soft and nontender. No distention.  Musculoskeletal: On examination of the right knee there is no gross deformity. There is some moderate tenderness on palpation of the patella. There is minimal effusion noted lateral aspect. Range of motion is positive for crepitus. Soft tissue tenderness is noted generalized over the anterior portion of the right knee. Patient is ambulatory but with a limp. Neurologic:  Normal speech and language. No gross  focal neurologic deficits are appreciated. No gait instability. Skin:  Skin is warm, dry and intact. No rash noted. No erythema, abrasions, ecchymosis noted. Psychiatric: Mood and affect are normal. Speech and behavior are normal.  ____________________________________________   LABS (all labs ordered are listed, but only abnormal results are displayed)  Labs Reviewed - No data to display  RADIOLOGY  Right knee x-ray per radiologist. Moderate suprapatellar joint effusion. No acute abnormality seen in  the right knee.  I, [name], personally viewed and evaluated these images (plain radiographs) as part of my medical decision making, as well as reviewing the written report by the radiologist.  ____________________________________________   PROCEDURES  Procedure(s) performed: None  Procedures  Critical Care performed: No  ____________________________________________   INITIAL IMPRESSION / ASSESSMENT AND PLAN / ED COURSE  Pertinent labs & imaging results that were available during my care of the patient were reviewed by me and considered in my medical decision making (see chart for details).    Clinical Course  Patient was placed in a knee immobilizer. He is given a prescription for Norco as needed for pain. He is instructed to call Dr. Ammie Ferrier office on Monday to make an appointment. He is continue ice and elevation as needed for knee pain and swelling.  ___________________________________________   FINAL CLINICAL IMPRESSION(S) / ED DIAGNOSES  Final diagnoses:  Knee pain, acute, right  Knee effusion, right      NEW MEDICATIONS STARTED DURING THIS VISIT:  Discharge Medication List as of 03/31/2016  9:43 PM       Note:  This document was prepared using Dragon voice recognition software and may include unintentional dictation errors.    [name], PA-C 03/31/16 2236    Orbie Pyo, MD 03/31/16 [phone]

## 2016-03-31 NOTE — ED Notes (Signed)
Reviewed d/c instructions, follow-up care and prescriptions with pt. Pt verbalized understanding 

## 2016-05-03 ENCOUNTER — Emergency Department: Payer: Self-pay

## 2016-05-03 ENCOUNTER — Emergency Department
Admission: EM | Admit: 2016-05-03 | Discharge: 2016-05-03 | Disposition: A | Discharge status: Home / Self Care | Payer: Self-pay | Attending: Emergency Medicine | Admitting: Emergency Medicine

## 2016-05-03 DIAGNOSIS — M778 Other enthesopathies, not elsewhere classified: Secondary | ICD-10-CM

## 2016-05-03 DIAGNOSIS — Z791 Long term (current) use of non-steroidal anti-inflammatories (NSAID): Secondary | ICD-10-CM | POA: Insufficient documentation

## 2016-05-03 DIAGNOSIS — X500XXA Overexertion from strenuous movement or load, initial encounter: Secondary | ICD-10-CM | POA: Insufficient documentation

## 2016-05-03 DIAGNOSIS — Y93F2 Activity, caregiving, lifting: Secondary | ICD-10-CM | POA: Insufficient documentation

## 2016-05-03 DIAGNOSIS — Z7982 Long term (current) use of aspirin: Secondary | ICD-10-CM | POA: Insufficient documentation

## 2016-05-03 DIAGNOSIS — Y929 Unspecified place or not applicable: Secondary | ICD-10-CM | POA: Insufficient documentation

## 2016-05-03 DIAGNOSIS — M779 Enthesopathy, unspecified: Secondary | ICD-10-CM | POA: Insufficient documentation

## 2016-05-03 DIAGNOSIS — J45909 Unspecified asthma, uncomplicated: Secondary | ICD-10-CM | POA: Insufficient documentation

## 2016-05-03 DIAGNOSIS — M654 Radial styloid tenosynovitis [de Quervain]: Secondary | ICD-10-CM | POA: Insufficient documentation

## 2016-05-03 DIAGNOSIS — Y990 Civilian activity done for income or pay: Secondary | ICD-10-CM | POA: Insufficient documentation

## 2016-05-03 MED ORDER — NAPROXEN 500 MG PO TBEC: 500.0000 mg | DELAYED_RELEASE_TABLET | Freq: Two times a day (BID) | ORAL | 0 refills | Status: DC

## 2016-05-03 NOTE — ED Provider Notes (Signed)
[facility] Emergency Department Provider Note ____________________________________________  Time seen: 1405  I have reviewed the triage vital signs and the nursing notes.  HISTORY  Chief Complaint  Hand Pain  HPI Dave Vasquez is a 50 y.o. male , left-hand dominant, presents to the ED for evaluation of pain to the right thumb. Patient describes at work yesterday,he began to experience increasing pain and disability to the right thumb at the wrist. 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 He denies any laceration, crush injury. He describes the pain at a 10/10 and pain is worse in the palm near the thumb. He also reports some irritation and pain to the volar aspect of the forearm.  Past Medical History:  Diagnosis Date  . Asthma     There are no active problems to display for this patient.  No past surgical history on file.  Prior to Admission medications   Medication Sig Start Date End Date Taking? Authorizing Provider  aspirin 81 MG tablet Take 81 mg by mouth daily.    Historical Provider, MD  cyclobenzaprine (FLEXERIL) 10 MG tablet Take 1 tablet (10 mg total) by mouth every 8 (eight) hours as needed for muscle spasms. 06/25/15   Arlyss Repress, PA-C  HYDROcodone-acetaminophen (NORCO/VICODIN) 5-325 MG tablet Take 1 tablet by mouth every 4 (four) hours as needed for moderate pain. 03/31/16   [name], PA-C  ibuprofen (ADVIL,MOTRIN) 800 MG tablet Take 1 tablet (800 mg total) by mouth every 8 (eight) hours as needed. 06/25/15   Arlyss Repress, PA-C  naproxen (EC NAPROSYN) 500 MG EC tablet Take 1 tablet (500 mg total) by mouth 2 (two) times daily with a meal. 05/03/16   [name], PA-C   Allergies Review of patient's allergies indicates no known allergies.  No family history on file.  Social History Social History  Substance Use Topics  . Smoking status: Never Smoker  . Smokeless tobacco: Never  Used  . Alcohol use No    Review of Systems  Constitutional: Negative for fever. Cardiovascular: Negative for chest pain. Respiratory: Negative for shortness of breath. Musculoskeletal: Negative for back pain. Right thumb pain as above. Skin: Negative for rash. Neurological: Negative for headaches, focal weakness or numbness. ____________________________________________  PHYSICAL EXAM:  VITAL SIGNS: ED Triage Vitals  Enc Vitals Group     BP 05/03/16 1217 (!) 140/92     Pulse Rate 05/03/16 1217 70     Resp 05/03/16 1217 16     Temp 05/03/16 1217 98.4 F (36.9 C)     Temp Source 05/03/16 1217 Oral     SpO2 05/03/16 1217 98 %     Weight 05/03/16 1219 193 lb (87.5 kg)     Height 05/03/16 1219 5\' 5"  (1.651 m)     Head Circumference --      Peak Flow --      Pain Score 05/03/16 1227 10     Pain Loc --      Pain Edu? --      Excl. in [place]? --    Constitutional: Alert and oriented. Well appearing and in no distress. Head: Normocephalic and atraumatic. Cardiovascular: Normal distal pulses. Respiratory: Normal respiratory effort.  Musculoskeletal: Right, no obvious deformity, dislocation, or swelling. Patient is tender to palpation to the base of the thumb at the [facility] joint. He also is noted to have normal composite fist and normal thumb extension. He does have a  positive Finkelstein on the right. Nontender with normal range of motion in all extremities.  Neurologic: Cranial nerves II through XII grossly intact. Normal intrinsic and opposition testing. Normal gait without ataxia. Normal speech and language. No gross focal neurologic deficits are appreciated. Skin:  Skin is warm, dry and intact. No rash noted. ____________________________________________   RADIOLOGY  Right Thumb IMPRESSION: Tiny focus of calcification in the first IP joint is likely of arthropathic etiology. There is, however, no erosive change or joint space narrowing. No fracture or dislocation.  I, Tamisha Nordstrom,  Dannielle Karvonen, personally viewed and evaluated these images (plain radiographs) as part of my medical decision making, as well as reviewing the written report by the radiologist. ____________________________________________  PROCEDURES  SPLINT APPLICATION Date/Time: [id number] Performed by: Melvenia Needles Consent: Verbal consent obtained. Risks and benefits: risks, benefits and alternatives were discussed Consent given by: patient Splint applied by: Tyleek Smick, PA-C Location details: right thumb Splint type: thumb spica Supplies used: OCL/ortho glass Post-procedure: The splinted body part was neurovascularly unchanged following the procedure. Patient tolerance: Patient tolerated the procedure well with no immediate complications. ____________________________________________  INITIAL IMPRESSION / ASSESSMENT AND PLAN / ED COURSE  Patient with DeQuervains tenosynovitis on the right. He is placed in an appropriate thumb spica splint for support. He is to follow-up with Dr. Truitt Leep ongoing symptom management. Work activities as tolerated secondary to splint.  Clinical Course   ____________________________________________  FINAL CLINICAL IMPRESSION(S) / ED DIAGNOSES  Final diagnoses:  Tendinitis of thumb  De Quervain's disease (tenosynovitis)      Melvenia Needles, PA-C 05/03/16 1921    Hinda Kehr, MD 05/03/16 2013

## 2016-05-03 NOTE — Discharge Instructions (Signed)
Wear the splint and take the prescription med as directed. Follow-up with [facility] as needed. Apply ice to reduce pain and swelling.

## 2016-05-03 NOTE — ED Triage Notes (Signed)
Pt arrives today with reports of lifting things at work yesterday and during the night he began experiencing pain to his right hand  Pt reports 10/10 pain to his right hand with pain being worse in the palm area near his thumb

## 2016-06-12 ENCOUNTER — Encounter: Payer: Self-pay | Admitting: Emergency Medicine

## 2016-06-12 ENCOUNTER — Emergency Department: Payer: Self-pay

## 2016-06-12 ENCOUNTER — Emergency Department
Admission: EM | Admit: 2016-06-12 | Discharge: 2016-06-12 | Disposition: A | Discharge status: Home / Self Care | Payer: Self-pay | Attending: Emergency Medicine | Admitting: Emergency Medicine

## 2016-06-12 DIAGNOSIS — S53401A Unspecified sprain of right elbow, initial encounter: Secondary | ICD-10-CM | POA: Insufficient documentation

## 2016-06-12 DIAGNOSIS — Y999 Unspecified external cause status: Secondary | ICD-10-CM | POA: Insufficient documentation

## 2016-06-12 DIAGNOSIS — Y929 Unspecified place or not applicable: Secondary | ICD-10-CM | POA: Insufficient documentation

## 2016-06-12 DIAGNOSIS — J45909 Unspecified asthma, uncomplicated: Secondary | ICD-10-CM | POA: Insufficient documentation

## 2016-06-12 DIAGNOSIS — Y939 Activity, unspecified: Secondary | ICD-10-CM | POA: Insufficient documentation

## 2016-06-12 DIAGNOSIS — W19XXXA Unspecified fall, initial encounter: Secondary | ICD-10-CM | POA: Insufficient documentation

## 2016-06-12 DIAGNOSIS — S46911A Strain of unspecified muscle, fascia and tendon at shoulder and upper arm level, right arm, initial encounter: Secondary | ICD-10-CM

## 2016-06-12 NOTE — ED Triage Notes (Signed)
States he fell last Monday at [facility] on right hand/arm  having increased pain to right hand and arm  No deformity noted

## 2016-06-12 NOTE — ED Provider Notes (Signed)
[facility] Emergency Department Provider Note  ____________________________________________   First MD Initiated Contact with Patient 06/12/16 1115     (approximate)  I have reviewed the triage vital signs and the nursing notes.   HISTORY  Chief Complaint Fall   HPI Dave Vasquez is a 50 y.o. male is here complaining of right elbow and hand pain for 7 days. Patient states he fell last Monday at [facility] and landed on his right hand and arm. Patient has continued to have pain despite taking ibuprofen 600 mg 3 times a day. He denies any head injury or loss of consciousness with this fall. He denies any abrasions or cuts. Patient rates his pain as 5 out of 10.   Past Medical History:  Diagnosis Date  . Asthma     There are no active problems to display for this patient.   History reviewed. No pertinent surgical history.  Prior to Admission medications   Not on File    Allergies Patient has no known allergies.  No family history on file.  Social History Social History  Substance Use Topics  . Smoking status: Never Smoker  . Smokeless tobacco: Never Used  . Alcohol use No    Review of Systems Constitutional: No fever/chills Eyes: No visual changes. ENT: No trauma Cardiovascular: Denies chest pain. Respiratory: Denies shortness of breath. Gastrointestinal: No abdominal pain.  No nausea, no vomiting.   Musculoskeletal: Right upper arm, wrist, hand pain. Skin: Negative for rash. Neurological: Negative for headaches, focal weakness or numbness.  10-point ROS otherwise negative.  ____________________________________________   PHYSICAL EXAM:  VITAL SIGNS: ED Triage Vitals  Enc Vitals Group     BP      Pulse      Resp      Temp      Temp src      SpO2      Weight      Height      Head Circumference      Peak Flow      Pain Score      Pain Loc      Pain Edu?      Excl. in [place]?     Constitutional: Alert and oriented.  Well appearing and in no acute distress. Eyes: Conjunctivae are normal. PERRL. EOMI. Head: Atraumatic. Nose: No congestion/rhinnorhea. Neck: No stridor.   Cardiovascular: Normal rate, regular rhythm. Grossly normal heart sounds.  Good peripheral circulation. Respiratory: Normal respiratory effort.  No retractions. Lungs CTAB. Gastrointestinal: Soft and nontender. No distention. Musculoskeletal: Examination of the right upper extremity there is no gross deformity noted. Patient is able to move digits without any difficulty and there is no tenderness on palpation of the hand. Patient is able to flex and extend his wrist slowly and there is no tenderness on palpation. There is no soft tissue swelling no tenderness of the hand and wrist. On examination of the elbow there is some soft tissue swelling present. There is no gross deformity. Patient is able to slowly flex and extend with minimal discomfort. Patient was able to rotate with some discomfort. Motor sensory function intact. Pulses positive. Neurologic:  Normal speech and language. No gross focal neurologic deficits are appreciated. No gait instability. Skin:  Skin is warm, dry and intact. No ecchymosis or abrasions were seen. Psychiatric: Mood and affect are normal. Speech and behavior are normal.  ____________________________________________   LABS (all labs ordered are listed, but only abnormal results are displayed)  Labs  Reviewed - No data to display  RADIOLOGY  Right elbow per radiologist is negative for fracture. I, [name], personally viewed and evaluated these images (plain radiographs) as part of my medical decision making, as well as reviewing the written report by the radiologist. ____________________________________________   PROCEDURES  Procedure(s) performed: None  Procedures  Critical Care performed: No  ____________________________________________   INITIAL IMPRESSION / ASSESSMENT AND PLAN / ED  COURSE  Pertinent labs & imaging results that were available during my care of the patient were reviewed by me and considered in my medical decision making (see chart for details).    Clinical Course    Patient was made aware that there is no fracture. Patient is continue taking ibuprofen 600 mg 3 times a day with food. He is to use ice as needed for pain and also soft tissue swelling as needed. He will follow-up with Dr. Marry Guan if any continued problems with his elbow. Note for limited use of his right upper extremity for the next 2-3 days.  ____________________________________________   FINAL CLINICAL IMPRESSION(S) / ED DIAGNOSES  Final diagnoses:  Elbow strain, right, initial encounter      NEW MEDICATIONS STARTED DURING THIS VISIT:  Discharge Medication List as of 06/12/2016 12:09 PM       Note:  This document was prepared using Dragon voice recognition software and may include unintentional dictation errors.    [name], PA-C 06/12/16 1220    Orbie Pyo, MD 06/12/16 [phone]

## 2016-06-12 NOTE — Discharge Instructions (Signed)
Follow-up with Dr. Marry Guan at [facility] if any continued problems with your elbow. Ice and elevate when possible. Continue taking ibuprofen 600 mg 3 times a day with food.

## 2016-11-24 ENCOUNTER — Emergency Department: Payer: Self-pay

## 2016-11-24 ENCOUNTER — Encounter: Payer: Self-pay | Admitting: Emergency Medicine

## 2016-11-24 ENCOUNTER — Emergency Department
Admission: EM | Admit: 2016-11-24 | Discharge: 2016-11-24 | Disposition: A | Discharge status: Home / Self Care | Payer: Self-pay | Attending: Emergency Medicine | Admitting: Emergency Medicine

## 2016-11-24 DIAGNOSIS — I10 Essential (primary) hypertension: Secondary | ICD-10-CM | POA: Insufficient documentation

## 2016-11-24 DIAGNOSIS — G44209 Tension-type headache, unspecified, not intractable: Secondary | ICD-10-CM | POA: Insufficient documentation

## 2016-11-24 DIAGNOSIS — J45909 Unspecified asthma, uncomplicated: Secondary | ICD-10-CM | POA: Insufficient documentation

## 2016-11-24 DIAGNOSIS — R519 Headache, unspecified: Secondary | ICD-10-CM

## 2016-11-24 DIAGNOSIS — R51 Headache: Secondary | ICD-10-CM

## 2016-11-24 LAB — BASIC METABOLIC PANEL
ANION GAP: 5 (ref 5–15)
BUN: 16 mg/dL (ref 6–20)
CALCIUM: 9.2 mg/dL (ref 8.9–10.3)
CO2: 31 mmol/L (ref 22–32)
CREATININE: 1.2 mg/dL (ref 0.61–1.24)
Chloride: 103 mmol/L (ref 101–111)
GFR calc Af Amer: >60 mL/min (ref >60)
GLUCOSE: 104 mg/dL — AB (ref 65–99)
Potassium: 4.3 mmol/L (ref 3.5–5.1)
Sodium: 139 mmol/L (ref 135–145)

## 2016-11-24 LAB — CBC
HCT: 42.9 % (ref 40.0–52.0)
Hemoglobin: 14.8 g/dL (ref 13.0–18.0)
MCH: 30.1 pg (ref 26.0–34.0)
MCHC: 34.5 g/dL (ref 32.0–36.0)
MCV: 87.1 fL (ref 80.0–100.0)
PLATELETS: 166 10*3/uL (ref 150–440)
RBC: 4.92 MIL/uL (ref 4.40–5.90)
RDW: 13.5 % (ref 11.5–14.5)
WBC: 6.7 10*3/uL (ref 3.8–10.6)

## 2016-11-24 LAB — TROPONIN I: Troponin I: <0.03 ng/mL (ref <0.03)

## 2016-11-24 MED ORDER — SODIUM CHLORIDE 0.9 % IV SOLN: 1000.0000 mL | Freq: Once | INTRAVENOUS | Status: DC

## 2016-11-24 MED ORDER — KETOROLAC TROMETHAMINE 30 MG/ML IJ SOLN
30.0000 mg | Freq: Once | INTRAMUSCULAR | Status: AC
  Administered 2016-11-24: 30 mg via INTRAVENOUS
  Filled 2016-11-24: qty 1

## 2016-11-24 NOTE — ED Provider Notes (Addendum)
[facility] Emergency Department Provider Note   ____________________________________________    I have reviewed the triage vital signs and the nursing notes.   HISTORY  Chief Complaint Hypertension     HPI Dave Vasquez is a 51 y.o. male who presents with complaints of high blood pressure and headache. Patient reports a history of headaches which he describes as bandlike and throbbing, he began having a headache this morning which was gradual in onset and similar to his previous headaches. He took a dose of propranolol which was given to him by his PCP for headaches. He reports his headache became worse and he went to the nurse at his job and they sent him here because his blood pressure was elevated. Currently he reports his headache is gotten much better and is a 6 out of 10 as compared to an 8 or 9 out of 10 earlier, he has not taken anything else for it. No neuro deficits. No fevers or chills. No neck pain.   Past Medical History:  Diagnosis Date  . Asthma     There are no active problems to display for this patient.   History reviewed. No pertinent surgical history.  Prior to Admission medications   Not on File     Allergies Patient has no known allergies.  History reviewed. No pertinent family history.  Social History Social History  Substance Use Topics  . Smoking status: Never Smoker  . Smokeless tobacco: Never Used  . Alcohol use No    Review of Systems  Constitutional: No fever/chills Eyes: No visual changes.  ENT: No Neck pain Cardiovascular: Denies chest pain. Respiratory: Denies shortness of breath. Gastrointestinal: No abdominal pain.  No nausea, no vomiting.   Genitourinary: Negative for dysuria. Musculoskeletal: Negative for back pain. Skin: Negative for rash. Neurological: Negative for Focal weakness   ____________________________________________   PHYSICAL EXAM:  VITAL SIGNS: ED Triage Vitals  Enc  Vitals Group     BP 11/24/16 1124 (!) 152/93     Pulse Rate 11/24/16 1124 (!) 52     Resp 11/24/16 1124 20     Temp 11/24/16 1124 98.4 F (36.9 C)     Temp Source 11/24/16 1124 Oral     SpO2 11/24/16 1124 97 %     Weight 11/24/16 1125 88.9 kg (196 lb)     Height 11/24/16 1125 1.651 m (5\' 5" )     Head Circumference --      Peak Flow --      Pain Score 11/24/16 1129 8     Pain Loc --      Pain Edu? --      Excl. in [place]? --     Constitutional: Alert and oriented. No acute distress. Pleasant and interactive Eyes: Conjunctivae are normal. PERRLA, EOMI Head: Atraumatic. Nose: No congestion/rhinnorhea. Mouth/Throat: Mucous membranes are moist.   Neck:  Painless ROM Cardiovascular: Normal rate, regular rhythm.   Good peripheral circulation. Respiratory: Normal respiratory effort.  No retractions.  Gastrointestinal: Soft and nontender. No distention.  Genitourinary: deferred Musculoskeletal: No lower extremity tenderness nor edema.  Warm and well perfused Neurologic:  Normal speech and language. No gross focal neurologic deficits are appreciated. Cranial nerves II through XII normal Skin:  Skin is warm, dry and intact. No rash noted. Psychiatric: Mood and affect are normal. Speech and behavior are normal.  ____________________________________________   LABS (all labs ordered are listed, but only abnormal results are displayed)  Labs Reviewed  BASIC  METABOLIC PANEL - Abnormal; Notable for the following:       Result Value   Glucose, Bld 104 (*)    All other components within normal limits  CBC  TROPONIN I   ____________________________________________  EKG  ED ECG REPORT I, Lavonia Drafts, the attending physician, personally viewed and interpreted this ECG.  Date: 11/24/2016  Rate: 57 Rhythm: Sinus bradycardia QRS Axis: normal Intervals: normal ST/T Wave abnormalities: Nonspecific T wave changes Conduction Disturbances:  none   ____________________________________________  RADIOLOGY  Chest x-ray unremarkable ____________________________________________   PROCEDURES  Procedure(s) performed: No    Critical Care performed: No ____________________________________________   INITIAL IMPRESSION / ASSESSMENT AND PLAN / ED COURSE  Pertinent labs & imaging results that were available during my care of the patient were reviewed by me and considered in my medical decision making (see chart for details).  Patient well-appearing and in no acute distress. Headache seems to be improving on its own and is consistent with prior headaches. Blood pressure is 152/93. No neuro deficits, reassuring exam and history of present illness. We will give a dose of Toradol and IV fluids and reevaluate.  ----------------------------------------- 3:12 PM on 11/24/2016 -----------------------------------------  Patient reports his head is feeling better and he needs to go pick up his daughter and wants to leave. He is not willing to wait any longer. He knows he can return at any time if worsening symptoms. He will follow-up with PCP for recheck of blood pressure    ____________________________________________   FINAL CLINICAL IMPRESSION(S) / ED DIAGNOSES  Final diagnoses:  Acute nonintractable headache, unspecified headache type  Hypertension, unspecified type      NEW MEDICATIONS STARTED DURING THIS VISIT:  There are no discharge medications for this patient.    Note:  This document was prepared using Dragon voice recognition software and may include unintentional dictation errors.    Lavonia Drafts, MD 11/24/16 1512    Lavonia Drafts, MD 11/24/16 [phone]

## 2016-11-24 NOTE — ED Notes (Signed)
First nurse note  Presents with headache and noticed that b/p was elevated

## 2016-11-24 NOTE — ED Notes (Signed)
Pt has been taking propanolol as needed for headache. He took one at 0530; still had headache and went to nurse at work and bp was 172/102 and she sent him to ED. Pt still has headache in front of head. NAD Noted.

## 2016-11-24 NOTE — ED Notes (Signed)
Pt verbalized understanding of discharge instructions. NAD at this time. 

## 2016-11-24 NOTE — ED Triage Notes (Signed)
Pt to ED from work c/o hypertension and headache today. Denies dizziness or passing out.  Denies hx or taking medication for BP.

## 2017-01-23 ENCOUNTER — Emergency Department
Admission: EM | Admit: 2017-01-23 | Discharge: 2017-01-23 | Disposition: A | Discharge status: Home / Self Care | Payer: Self-pay | Attending: Student in an Organized Health Care Education/Training Program | Admitting: Student in an Organized Health Care Education/Training Program

## 2017-01-23 ENCOUNTER — Encounter: Payer: Self-pay | Admitting: Emergency Medicine

## 2017-01-23 DIAGNOSIS — I10 Essential (primary) hypertension: Secondary | ICD-10-CM | POA: Insufficient documentation

## 2017-01-23 DIAGNOSIS — J45909 Unspecified asthma, uncomplicated: Secondary | ICD-10-CM | POA: Insufficient documentation

## 2017-01-23 MED ORDER — AMLODIPINE BESYLATE 2.5 MG PO TABS: 2.5000 mg | ORAL_TABLET | Freq: Every day | ORAL | 0 refills | Status: DC

## 2017-01-23 NOTE — ED Triage Notes (Addendum)
Pt to ed with c/o HTN.  Pt states he was recently seen by PMD and started on meds for headache.  States today was at work and checked BP and it was 182/95.  Reports his headache has continued rates 5/10.  Pt BP at triage 154/9, pt denies chest pain at this time.

## 2017-01-23 NOTE — ED Provider Notes (Signed)
[facility] Emergency Department Provider Note    First MD Initiated Contact with Patient 01/23/17 [phone]     (approximate)  I have reviewed the triage vital signs and the nursing notes.   HISTORY  Chief Complaint Hypertension    HPI Dave Vasquez is a 51 y.o. male presents to the ER with chief complaint of elevated blood pressure. Patient presents after being checked at his works nurse office and had blood pressure elevated to 170s over 90. Patient states he has been having on and off headaches over the past several weeks but has a history of chronic headaches. Denies any headache right now. No blurry vision. No numbness or tingling. No chest pain or shortness of breath. States upon arrival to the ER he is feeling better and his blood pressure had improved. He is not on any antihypertensive medications despite having recent readings and clinics of up to 170.   Past Medical History:  Diagnosis Date  . Asthma    History reviewed. No pertinent family history. History reviewed. No pertinent surgical history. There are no active problems to display for this patient.     Prior to Admission medications   Not on File    Allergies Patient has no known allergies.    Social History Social History  Substance Use Topics  . Smoking status: Never Smoker  . Smokeless tobacco: Never Used  . Alcohol use No    Review of Systems Patient denies headaches, rhinorrhea, blurry vision, numbness, shortness of breath, chest pain, edema, cough, abdominal pain, nausea, vomiting, diarrhea, dysuria, fevers, rashes or hallucinations unless otherwise stated above in HPI. ____________________________________________   PHYSICAL EXAM:  VITAL SIGNS: Vitals:   01/23/17 1028  BP: (!) 154/91  Pulse: (!) 55  Resp: 20  Temp: 98.5 F (36.9 C)    Constitutional: Alert and oriented. Well appearing and in no acute distress. Eyes: Conjunctivae are normal.  Head:  Atraumatic. Nose: No congestion/rhinnorhea. Mouth/Throat: Mucous membranes are moist.   Neck: Painless ROM.  Cardiovascular:   Good peripheral circulation. Respiratory: Normal respiratory effort.  No retractions.  Gastrointestinal: Soft and nontender.  Musculoskeletal: No lower extremity tenderness .  No joint effusions. Neurologic:  CN- intact.  No facial droop, Normal FNF.  Normal heel to shin.  Sensation intact bilaterally. Normal speech and language. No gross focal neurologic deficits are appreciated. No gait instability.  Skin:  Skin is warm, dry and intact. No rash noted. Psychiatric: Mood and affect are normal. Speech and behavior are normal.  ____________________________________________   LABS (all labs ordered are listed, but only abnormal results are displayed)  No results found for this or any previous visit (from the past 24 hour(s)). ____________________________________________  EKG____________________________________________  RADIOLOGY   ____________________________________________   PROCEDURES  Procedure(s) performed:  Procedures    Critical Care performed: no ____________________________________________   INITIAL IMPRESSION / ASSESSMENT AND PLAN / ED COURSE  Pertinent labs & imaging results that were available during my care of the patient were reviewed by me and considered in my medical decision making (see chart for details).  DDX: htnive urgency, essential htn, chronic headache  Dave Vasquez is a 51 y.o. who presents to the ED with asymptomatic hypertension. Patient is well-appearing and in no acute distress. Neuro exam is nonfocal. He has no meningeal irritation. No evidence of hypertensive encephalopathy. Lung sounds are clear. His good peripheral pulses and denies any chest pain or shortness of breath. Follow-up with his PCP and at this point  do feel that given his 3 separate hypertensive medications that he would benefit from starting a low-dose  antihypertensive medication.  Have discussed with the patient and available family all diagnostics and treatments performed thus far and all questions were answered to the best of my ability. The patient demonstrates understanding and agreement with plan.       ____________________________________________   FINAL CLINICAL IMPRESSION(S) / ED DIAGNOSES  Final diagnoses:  Hypertension, unspecified type      NEW MEDICATIONS STARTED DURING THIS VISIT:  New Prescriptions   No medications on file     Note:  This document was prepared using Dragon voice recognition software and may include unintentional dictation errors.     Merlyn Lot, MD 01/23/17 1055

## 2017-02-26 DIAGNOSIS — I10 Essential (primary) hypertension: Secondary | ICD-10-CM | POA: Insufficient documentation

## 2017-11-29 ENCOUNTER — Other Ambulatory Visit: Payer: Self-pay | Admitting: Physician Assistant

## 2017-11-29 ENCOUNTER — Ambulatory Visit
Admission: RE | Admit: 2017-11-29 | Discharge: 2017-11-29 | Disposition: A | Discharge status: Home / Self Care | Payer: Commercial Managed Care - PPO | Source: Ambulatory Visit | Attending: Physician Assistant | Admitting: Physician Assistant

## 2017-11-29 DIAGNOSIS — R51 Headache: Secondary | ICD-10-CM | POA: Diagnosis not present

## 2017-11-29 DIAGNOSIS — R519 Headache, unspecified: Secondary | ICD-10-CM

## 2017-11-29 DIAGNOSIS — J322 Chronic ethmoidal sinusitis: Secondary | ICD-10-CM | POA: Diagnosis not present

## 2017-11-29 DIAGNOSIS — R110 Nausea: Secondary | ICD-10-CM

## 2018-09-26 IMAGING — DX DG FINGER THUMB 2+V*R*
3 series · 3 of 3 positions shown · non-contrast
Comparison: None.

CLINICAL DATA: Pain after heavy lifting

EXAM:
RIGHT THUMB 2+V

[finger ap]
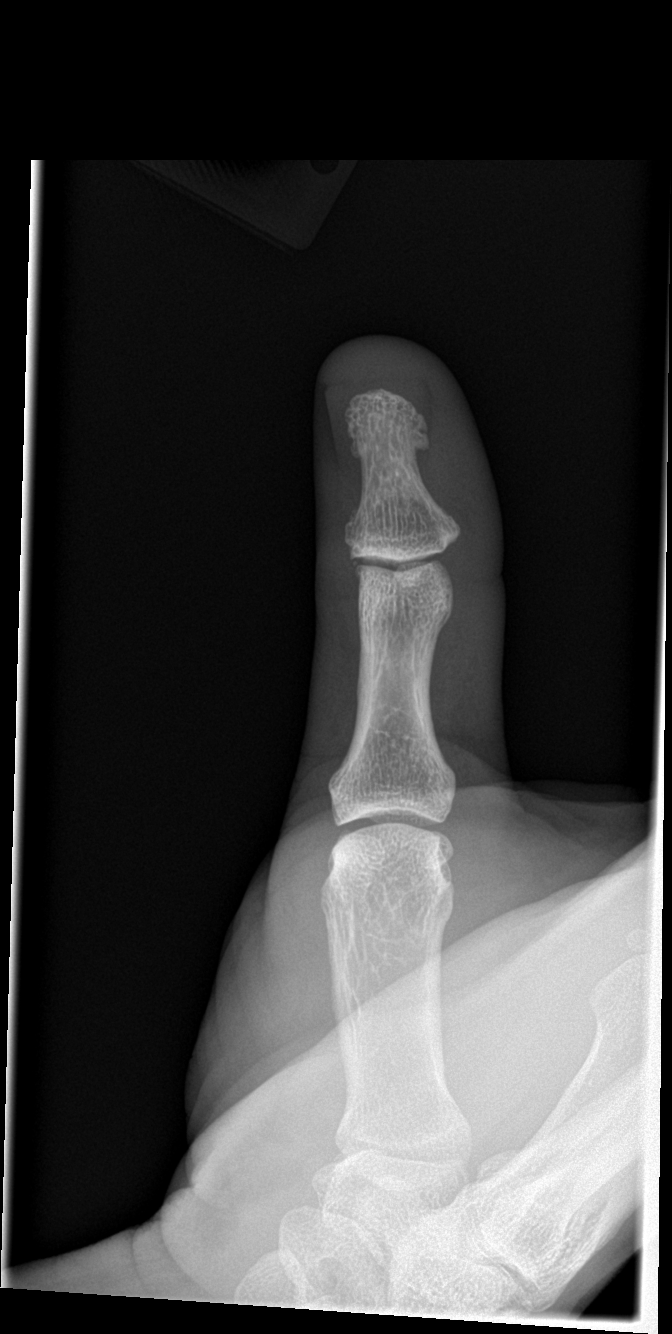

[finger obl]
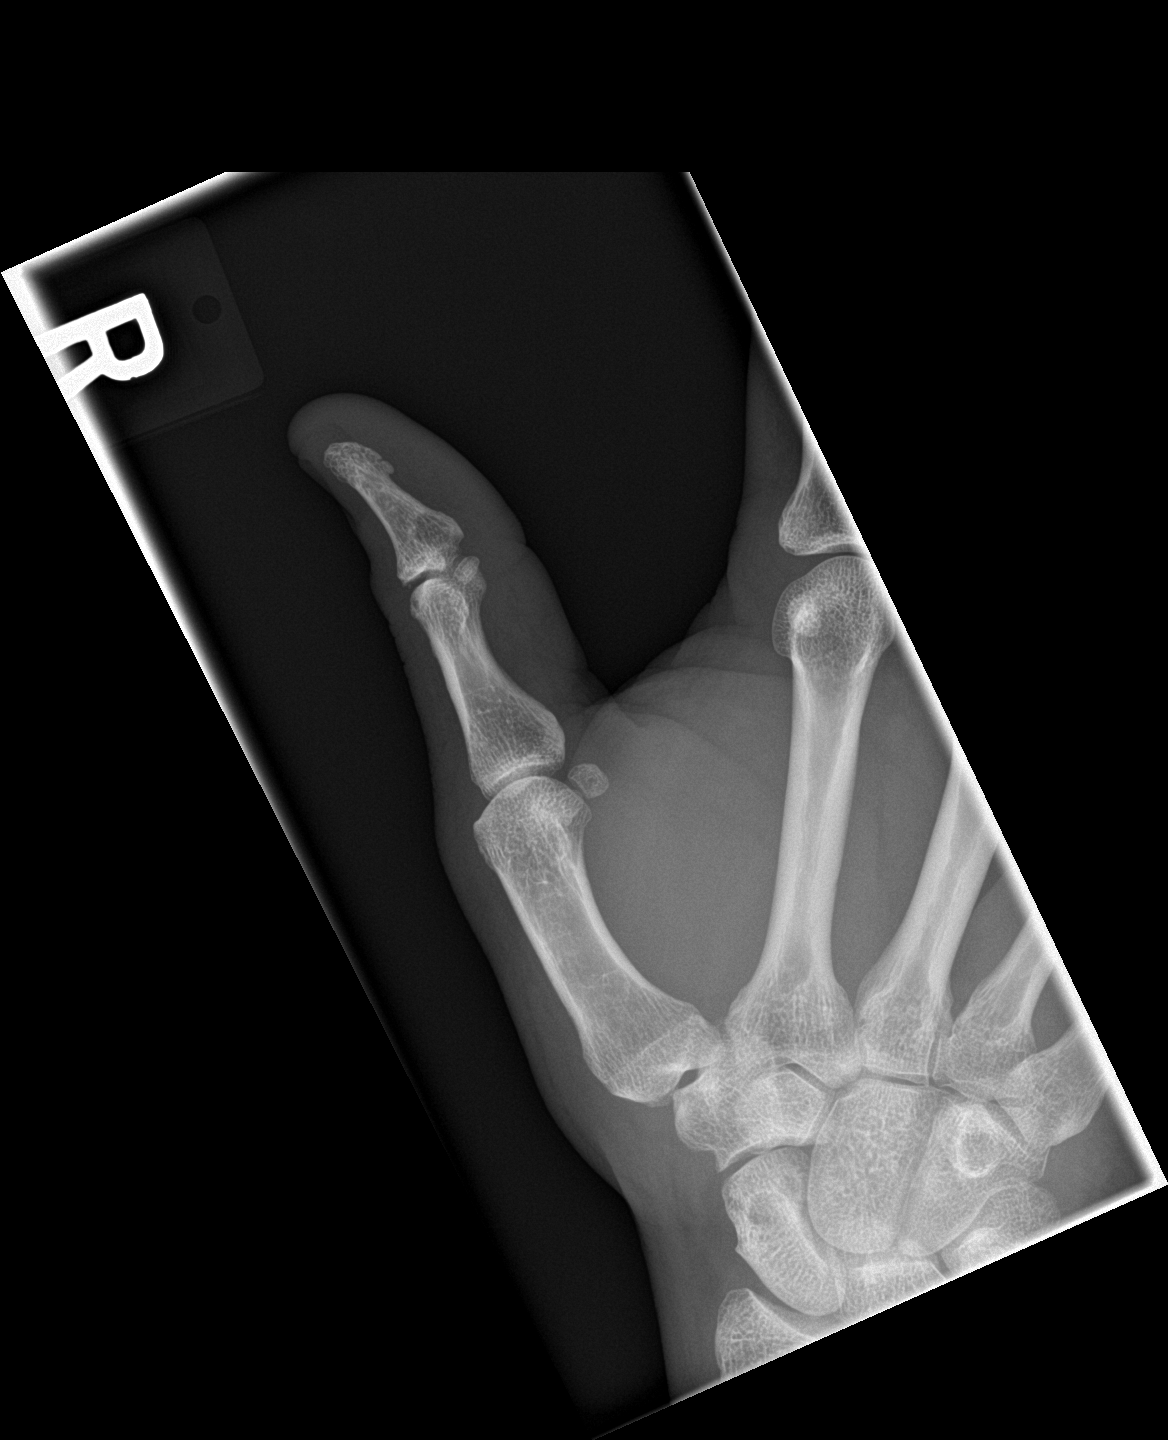

[finger lat]
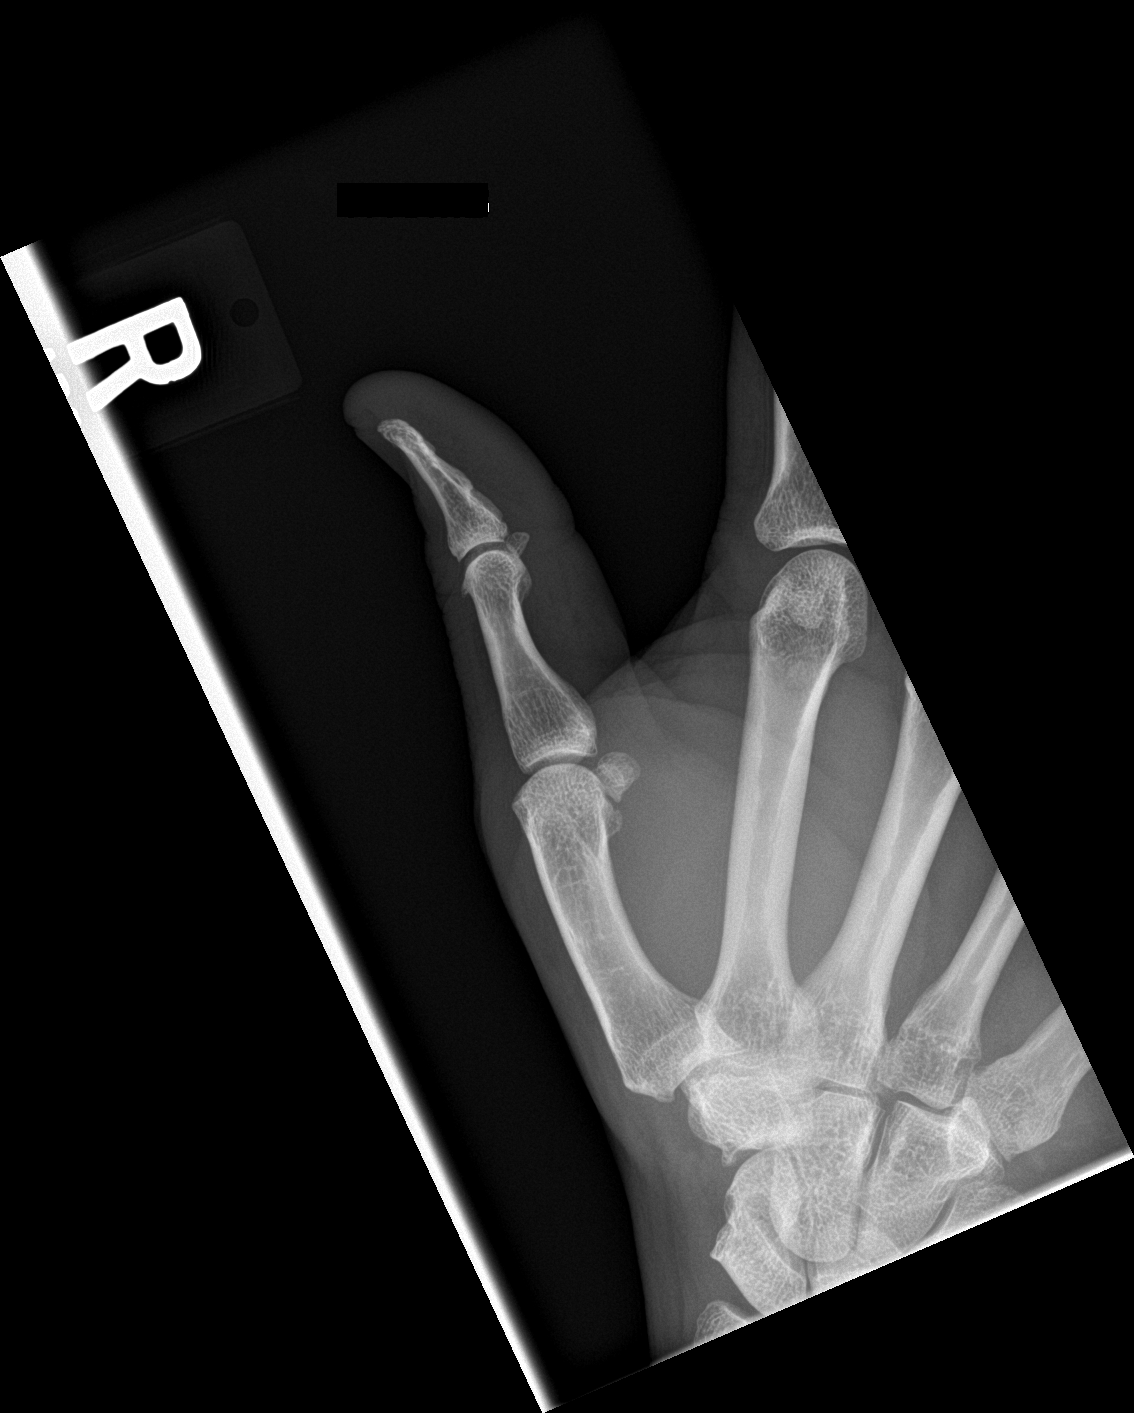

[3 of 3 positions shown; findings below may reference images not displayed]

FINDINGS: Frontal, oblique, and lateral views obtained. There is no fracture
or dislocation. The joint spaces appear normal. A tiny focus of
calcification medial aspect of the first IP joint is likely of
arthropathic etiology. No erosive change.
IMPRESSION: Tiny focus of calcification in the first IP joint is likely of
arthropathic etiology. There is, however, no erosive change or joint
space narrowing. No fracture or dislocation.

## 2019-09-28 ENCOUNTER — Ambulatory Visit: Payer: Self-pay | Attending: Internal Medicine

## 2019-09-28 DIAGNOSIS — Z23 Encounter for immunization: Secondary | ICD-10-CM

## 2019-09-28 NOTE — Progress Notes (Signed)
   Covid-19 Vaccination Clinic  Name:  Dave Vasquez    MRN: [id number] DOB: 02/01/1966  09/28/2019  [name] was observed post Covid-19 immunization for 15 minutes without incident. He was provided with Vaccine Information Sheet and instruction to access the V-Safe system.   [name] was instructed to call 911 with any severe reactions post vaccine: Marland Kitchen Difficulty breathing  . Swelling of face and throat  . A fast heartbeat  . A bad rash all over body  . Dizziness and weakness   Immunizations Administered    Name Date Dose VIS Date Route   Pfizer COVID-19 Vaccine 09/28/2019  4:26 PM 0.3 mL 06/13/2019 Intramuscular   Manufacturer: [place]   Lot: [id number]   [place]: [id number]

## 2019-10-19 ENCOUNTER — Ambulatory Visit: Payer: Self-pay | Attending: Internal Medicine

## 2019-10-19 DIAGNOSIS — Z23 Encounter for immunization: Secondary | ICD-10-CM

## 2019-10-19 NOTE — Progress Notes (Signed)
   Covid-19 Vaccination Clinic  Name:  Dave Vasquez    MRN: [id number] DOB: 26-May-1966  10/19/2019  Mr. Dave Vasquez was observed post Covid-19 immunization for 15 minutes without incident. He was provided with Vaccine Information Sheet and instruction to access the V-Safe system.   Mr. Dave Vasquez was instructed to call 911 with any severe reactions post vaccine: Marland Kitchen Difficulty breathing  . Swelling of face and throat  . A fast heartbeat  . A bad rash all over body  . Dizziness and weakness   Immunizations Administered    Name Date Dose VIS Date Route   Pfizer COVID-19 Vaccine 10/19/2019  4:09 PM 0.3 mL 06/13/2019 Intramuscular   Manufacturer: [place]   Lot: [id number]   [place]: [id number]

## 2020-01-14 ENCOUNTER — Emergency Department
Admission: EM | Admit: 2020-01-14 | Discharge: 2020-01-14 | Disposition: A | Discharge status: Home / Self Care | Payer: Self-pay | Attending: Student in an Organized Health Care Education/Training Program | Admitting: Student in an Organized Health Care Education/Training Program

## 2020-01-14 ENCOUNTER — Encounter: Payer: Self-pay | Admitting: Emergency Medicine

## 2020-01-14 ENCOUNTER — Other Ambulatory Visit: Payer: Self-pay

## 2020-01-14 DIAGNOSIS — I10 Essential (primary) hypertension: Secondary | ICD-10-CM | POA: Insufficient documentation

## 2020-01-14 DIAGNOSIS — Y9289 Other specified places as the place of occurrence of the external cause: Secondary | ICD-10-CM | POA: Insufficient documentation

## 2020-01-14 DIAGNOSIS — J45909 Unspecified asthma, uncomplicated: Secondary | ICD-10-CM | POA: Insufficient documentation

## 2020-01-14 DIAGNOSIS — Y999 Unspecified external cause status: Secondary | ICD-10-CM | POA: Insufficient documentation

## 2020-01-14 DIAGNOSIS — Z79899 Other long term (current) drug therapy: Secondary | ICD-10-CM | POA: Insufficient documentation

## 2020-01-14 DIAGNOSIS — Y939 Activity, unspecified: Secondary | ICD-10-CM | POA: Insufficient documentation

## 2020-01-14 DIAGNOSIS — Z20822 Contact with and (suspected) exposure to covid-19: Secondary | ICD-10-CM | POA: Insufficient documentation

## 2020-01-14 DIAGNOSIS — Y929 Unspecified place or not applicable: Secondary | ICD-10-CM | POA: Insufficient documentation

## 2020-01-14 DIAGNOSIS — S80869A Insect bite (nonvenomous), unspecified lower leg, initial encounter: Secondary | ICD-10-CM | POA: Insufficient documentation

## 2020-01-14 DIAGNOSIS — R519 Headache, unspecified: Secondary | ICD-10-CM

## 2020-01-14 DIAGNOSIS — W57XXXA Bitten or stung by nonvenomous insect and other nonvenomous arthropods, initial encounter: Secondary | ICD-10-CM

## 2020-01-14 DIAGNOSIS — M791 Myalgia, unspecified site: Secondary | ICD-10-CM | POA: Insufficient documentation

## 2020-01-14 HISTORY — DX: Essential (primary) hypertension: I10

## 2020-01-14 LAB — SARS CORONAVIRUS 2 BY RT PCR (HOSPITAL ORDER, PERFORMED IN ~~LOC~~ HOSPITAL LAB): SARS Coronavirus 2: NEGATIVE

## 2020-01-14 MED ORDER — DEXAMETHASONE SODIUM PHOSPHATE 10 MG/ML IJ SOLN
10.0000 mg | Freq: Once | INTRAMUSCULAR | Status: AC
  Administered 2020-01-14: 10 mg via INTRAMUSCULAR
  Filled 2020-01-14: qty 1

## 2020-01-14 MED ORDER — DOXYCYCLINE HYCLATE 100 MG PO TABS: 100.0000 mg | ORAL_TABLET | Freq: Two times a day (BID) | ORAL | 0 refills | Status: AC

## 2020-01-14 MED ORDER — DOXYCYCLINE HYCLATE 100 MG PO TABS
100.0000 mg | ORAL_TABLET | Freq: Once | ORAL | Status: AC
  Administered 2020-01-14: 100 mg via ORAL
  Filled 2020-01-14: qty 1

## 2020-01-14 NOTE — ED Notes (Signed)
See triage note  Presents with pain between his eyes and bilateral thigh discomfort  States sx's started couple of days ago  Denies any fever  But did remove a tick about 2 weeks ago    States he has had COVID and the vaccine but feels like he has it again  Afebrile on arrival

## 2020-01-14 NOTE — ED Triage Notes (Signed)
Pt reports pulled 2 ticks off of his left leg last week and now has had a HA for the last 3 days. Pt denies fevers. Pt reports pain relieved with tylenol but then HA comes back.

## 2020-01-14 NOTE — Discharge Instructions (Addendum)
You are being treated for a tick bite, which is now giving symptoms concerning for tickborne illness. Take the antibiotic as directed. Take OTC Tylenol and Motrin or steroid as directed. Follow-up with your provider for continued.

## 2020-01-14 NOTE — ED Provider Notes (Signed)
[facility] Emergency Department Provider Note ____________________________________________  Time seen: 1015  I have reviewed the triage vital signs and the nursing notes.  HISTORY  Chief Complaint  Insect Bite and Headache  HPI Dave Vasquez is a 54 y.o. male presents himself to the ED for evaluation of  bilateral leg pain over the last week.  He is also reporting some mild headache over the last 3 days.  Patient denies any frank fevers, chills, or sweats.  Just describes malaise in general but also denies any cough or congestion but he describes symptoms are similar to when he had Covid back in December.  He does note that he pulled 2 ticks off of his leg about 2 weeks ago.  He denies that the ticks were engorged, I do not believe they were attached for more than 24 hours.  Week prior to that he had a large tick crawling across his back, but he denies that it was attached.  Patient without any significant cough, congestion, chest pain, or shortness of breath.  Past Medical History:  Diagnosis Date  . Asthma   . Hypertension     There are no problems to display for this patient.   History reviewed. No pertinent surgical history.  Prior to Admission medications   Medication Sig Start Date End Date Taking? Authorizing Provider  amLODipine (NORVASC) 2.5 MG tablet Take 1 tablet (2.5 mg total) by mouth daily. 01/23/17 01/23/18  Merlyn Lot, MD  doxycycline (VIBRA-TABS) 100 MG tablet Take 1 tablet (100 mg total) by mouth 2 (two) times daily for 3 days. 01/14/20 01/17/20  Davonda Ausley, Dannielle Karvonen, PA-C    Allergies Patient has no known allergies.  History reviewed. No pertinent family history.  Social History Social History   Tobacco Use  . Smoking status: Never Smoker  . Smokeless tobacco: Never Used  Substance Use Topics  . Alcohol use: No  . Drug use: No    Review of Systems  Constitutional: Negative for fever. Eyes: Negative for visual  changes. ENT: Negative for sore throat. Cardiovascular: Negative for chest pain. Respiratory: Negative for shortness of breath. Gastrointestinal: Negative for abdominal pain, vomiting and diarrhea. Genitourinary: Negative for dysuria. Musculoskeletal: Negative for back pain.  Bilateral leg pain myalgias as above. Skin: Negative for rash. Neurological: Negative for headaches, focal weakness or numbness. ____________________________________________  PHYSICAL EXAM:  VITAL SIGNS: ED Triage Vitals  Enc Vitals Group     BP 01/14/20 0848 (!) 151/92     Pulse Rate 01/14/20 0848 (!) 59     Resp 01/14/20 0848 18     Temp 01/14/20 0848 98.2 F (36.8 C)     Temp Source 01/14/20 0848 Oral     SpO2 01/14/20 0848 98 %     Weight 01/14/20 0846 188 lb (85.3 kg)     Height 01/14/20 0846 5\' 5"  (1.651 m)     Head Circumference --      Peak Flow --      Pain Score 01/14/20 0846 7     Pain Loc --      Pain Edu? --      Excl. in [place]? --     Constitutional: Alert and oriented. Well appearing and in no distress. Head: Normocephalic and atraumatic. Eyes: Conjunctivae are normal. Normal extraocular movements Cardiovascular: Normal rate, regular rhythm. Normal distal pulses. Respiratory: Normal respiratory effort. No wheezes/rales/rhonchi. Gastrointestinal: Soft and nontender. No distention. Musculoskeletal: Nontender with normal range of motion in all extremities.  Neurologic:  Normal gait without ataxia. Normal speech and language. No gross focal neurologic deficits are appreciated. Skin:  Skin is warm, dry and intact. No rash noted. Psychiatric: Mood and affect are normal. Patient exhibits appropriate insight and judgment. ____________________________________________  PROCEDURES  Decadron 10 mg PO Doxycycline 100 mg PO _________________________________________________  LABORATORY  Labs Reviewed  SARS CORONAVIRUS 2 BY RT PCR (HOSPITAL ORDER, [facility])   ____________________________________________  INITIAL IMPRESSION / ASSESSMENT AND PLAN / ED COURSE  Patient with ED evaluation of generalized myalgias and leg pain bilaterally.  In the face of his recent tick bite exposure and the timing of his symptom onset I think is reasonable to consider Lyme disease as a possible etiology.  As such patient will be treated empirically with doxycycline for 3 days.  Given his concern over possible Covid exposure and reinfection, Covid test was again performed.  Patient will be discharged with a prescription for doxycycline and will follow with as directed.  He is also given a prescription for prednisone to take as directed.  Return precautions have been discussed.  Dave Vasquez was evaluated in Emergency Department on 01/14/2020 for the symptoms described in the history of present illness. He was evaluated in the context of the global COVID-19 pandemic, which necessitated consideration that the patient might be at risk for infection with the SARS-CoV-2 virus that causes COVID-19. Institutional protocols and algorithms that pertain to the evaluation of patients at risk for COVID-19 are in a state of rapid change based on information released by regulatory bodies including the CDC and federal and state organizations. These policies and algorithms were followed during the patient's care in the ED. ____________________________________________  FINAL CLINICAL IMPRESSION(S) / ED DIAGNOSES  Final diagnoses:  Tick bite, initial encounter  Bad headache      Carmie End, Dannielle Karvonen, PA-C 01/14/20 1910    Merlyn Lot, MD 01/15/20 0730

## 2021-10-14 ENCOUNTER — Ambulatory Visit (INDEPENDENT_AMBULATORY_CARE_PROVIDER_SITE_OTHER): Payer: 59 | Admitting: Nurse Practitioner

## 2021-10-14 ENCOUNTER — Encounter: Payer: Self-pay | Admitting: Nurse Practitioner

## 2021-10-14 ENCOUNTER — Other Ambulatory Visit: Payer: Self-pay

## 2021-10-14 VITALS — BP 140/80 | HR 64 | Temp 98.1°F | Resp 16 | Ht 65.0 in | Wt 198.6 lb

## 2021-10-14 DIAGNOSIS — M545 Low back pain, unspecified: Secondary | ICD-10-CM

## 2021-10-14 DIAGNOSIS — I10 Essential (primary) hypertension: Secondary | ICD-10-CM

## 2021-10-14 DIAGNOSIS — Z125 Encounter for screening for malignant neoplasm of prostate: Secondary | ICD-10-CM

## 2021-10-14 DIAGNOSIS — Z0000 Encounter for general adult medical examination without abnormal findings: Secondary | ICD-10-CM | POA: Diagnosis not present

## 2021-10-14 DIAGNOSIS — Z114 Encounter for screening for human immunodeficiency virus [HIV]: Secondary | ICD-10-CM

## 2021-10-14 DIAGNOSIS — Z131 Encounter for screening for diabetes mellitus: Secondary | ICD-10-CM

## 2021-10-14 DIAGNOSIS — J4520 Mild intermittent asthma, uncomplicated: Secondary | ICD-10-CM

## 2021-10-14 DIAGNOSIS — Z13220 Encounter for screening for lipoid disorders: Secondary | ICD-10-CM

## 2021-10-14 DIAGNOSIS — Z1159 Encounter for screening for other viral diseases: Secondary | ICD-10-CM

## 2021-10-14 DIAGNOSIS — Z1211 Encounter for screening for malignant neoplasm of colon: Secondary | ICD-10-CM

## 2021-10-14 MED ORDER — ALBUTEROL SULFATE HFA 108 (90 BASE) MCG/ACT IN AERS: INHALATION_SPRAY | RESPIRATORY_TRACT | 3 refills | Status: DC

## 2021-10-14 MED ORDER — PREDNISONE 10 MG (21) PO TBPK: ORAL_TABLET | ORAL | 0 refills | Status: DC

## 2021-10-14 MED ORDER — AMLODIPINE BESYLATE 5 MG PO TABS: 5.0000 mg | ORAL_TABLET | Freq: Every day | ORAL | 1 refills | Status: DC

## 2021-10-14 MED ORDER — CYCLOBENZAPRINE HCL 10 MG PO TABS: 10.0000 mg | ORAL_TABLET | Freq: Three times a day (TID) | ORAL | 0 refills | Status: DC | PRN

## 2021-10-14 NOTE — Progress Notes (Signed)
? ?BP 140/80   Pulse 64   Temp 98.1 ?F (36.7 ?C) (Oral)   Resp 16   Ht '5\' 5"'$  (1.651 m)   Wt 198 lb 9.6 oz (90.1 kg)   SpO2 98%   BMI 33.05 kg/m?   ? ? ?Name: Dave Vasquez   MRN: [id number]    DOB: Feb 14, 1966   Date:10/14/2021 ? ?     Progress Note ? ?Subjective ? ?Chief Complaint ? ?Chief Complaint  ?Patient presents with  ? Establish Care  ? Hypertension  ? Asthma  ? Back Pain  ? ? ?HPI ? ?Patient presents for annual CPE, to establish care, chronic and acute issue.  Patient stated his visit had to be preventative, due to his insurance.  Discussed that he might get a bill because we would also be discussing his chronic issues and an acute issue.  ? ?Establish care: He was receiving care at [facility] and his insurance is no longer accepted there.  He has a history of hypertension treated with amlodipine and a history of asthma treated with albuterol. ? ?Hypertension: He was prescribed amlodipine 2.5 mg to take 2 for total of 5 mg daily.  He has been out of his medication for months.  He denies any chest pain shortness of breath headaches or blurred vision.  His blood pressure today was 158/88 recheck was 140/80 we will send a prescription for amlodipine 5 mg.  ? ?Asthma: He says he uses his albuterol inhaler for flares.  He says mostly its after like mowing the lawn.  He denies any shortness of breath or wheezing at this time.  He says he has been out of his albuterol inhaler for a few months.  Will send in refill. ? ?Low back pain: He says on Monday he bent down and his back locked up.  He has been taking liquid Tylenol because he has trouble with pills.  He is also wearing a back brace for support. He denies any trauma other than bending down to pick something up.  He says his back just feels real tight he denies any incontinence, no radiation of pain and no sciatica.  Continue with Tylenol or ibuprofen liquid form over-the-counter, prescribed Flexeril and prednisone pack.  May also use heat  therapy. ? ?IPSS Questionnaire (AUA-7): ?Over the past month?   ?1)  How often have you had a sensation of not emptying your bladder completely after you finish urinating?  0 - Not at all  ?2)  How often have you had to urinate again less than two hours after you finished urinating? 3 - About half the time  ?3)  How often have you found you stopped and started again several times when you urinated?  0 - Not at all  ?4) How difficult have you found it to postpone urination?  1 - Less than 1 time in 5  ?5) How often have you had a weak urinary stream?  0 - Not at all  ?6) How often have you had to push or strain to begin urination?  0 - Not at all  ?7) How many times did you most typically get up to urinate from the time you went to bed until the time you got up in the morning?  3 - 3 times  ?Total score:  0-7 mildly symptomatic  ? 8-19 moderately symptomatic  ? 20-35 severely symptomatic  ?  ? ?Diet: Well balanced diet, he says he likes cereal ?Exercise: walking, discussed recommendations  150 minutes of physical activity a week ?Sleep: he says he does not sleep too well maybe gets 5 hrs ? ?Depression: phq 9 is negative ? ?  10/14/2021  ?  9:20 AM  ?Depression screen PHQ 2/9  ?Decreased Interest 0  ?Down, Depressed, Hopeless 0  ?PHQ - 2 Score 0  ? ? ?Hypertension:  ?BP Readings from Last 3 Encounters:  ?10/14/21 140/80  ?01/14/20 (!) 149/100  ?01/23/17 (!) 145/98  ? ? ?Obesity: ?Wt Readings from Last 3 Encounters:  ?10/14/21 198 lb 9.6 oz (90.1 kg)  ?01/14/20 188 lb (85.3 kg)  ?01/23/17 196 lb (88.9 kg)  ? ?BMI Readings from Last 3 Encounters:  ?10/14/21 33.05 kg/m?  ?01/14/20 31.28 kg/m?  ?01/23/17 32.62 kg/m?  ?  ? ?Lipids:  ?No results found for: CHOL ?No results found for: HDL ?No results found for: [place] ?No results found for: TRIG ?No results found for: CHOLHDL ?No results found for: LDLDIRECT ?Glucose:  ?Glucose  ?Date Value Ref Range Status  ?10/23/2013 88 65 - 99 mg/dL Final  ?12/06/2012 88 65 - 99 mg/dL  Final  ?09/03/2011 81 65 - 99 mg/dL Final  ? ?Glucose, Bld  ?Date Value Ref Range Status  ?11/24/2016 104 (H) 65 - 99 mg/dL Final  ?12/06/2014 102 (H) 65 - 99 mg/dL Final  ? ? ?[place] Office Visit from 10/14/2021 in [facility]  ?AUDIT-C Score 0  ? ?  ? ? ?Single ?STD testing and prevention (HIV/chl/gon/syphilis): ordered ?Hep C: ordered ? ?Skin cancer: Discussed monitoring for atypical lesions ?Colorectal cancer: ordered ?Prostate cancer: ordered ?No results found for: PSA ? ? ?Lung cancer:   Low Dose CT Chest recommended if Age 39-80 years, 30 pack-year currently smoking OR have quit w/in 15years. Patient does not qualify.   ?AAA:  The USPSTF recommends one-time screening with ultrasonography in men ages 73 to 80 years who have ever smoked ?ECG:  11/26/2016 ? ?Vaccines:  ?HPV: up to at age 19 , ask insurance if age between 10-45  ?Shingrix: 47-64 yo and ask insurance if covered when patient above 21 yo ?Pneumonia:  educated and discussed with patient. ?Flu:  educated and discussed with patient. ? ?Advanced Care Planning: A voluntary discussion about advance care planning including the explanation and discussion of advance directives.  Discussed health care proxy and Living will, and the patient was able to identify a health care proxy as .  Patient does not have a living will at present time. If patient does have living will, I have requested they bring this to the clinic to be scanned in to their chart. ? ?There are no problems to display for this patient. ? ? ?No past surgical history on file. ? ?Family History  ?Problem Relation Age of Onset  ? Diabetes Mother   ? ? ?Social History  ? ?Socioeconomic History  ? Marital status: Single  ?  Spouse name: Not on file  ? Number of children: 8  ? Years of education: Not on file  ? Highest education level: Not on file  ?Occupational History  ? Not on file  ?Tobacco Use  ? Smoking status: Never  ? Smokeless tobacco: Never  ?Vaping Use  ? Vaping  Use: Never used  ?Substance and Sexual Activity  ? Alcohol use: No  ? Drug use: No  ? Sexual activity: Not Currently  ?Other Topics Concern  ? Not on file  ?Social History Narrative  ? Not on file  ? ?Social Determinants of Health  ? ?  Financial Resource Strain: Low Risk   ? Difficulty of Paying Living Expenses: Not hard at all  ?Food Insecurity: No Food Insecurity  ? Worried About Charity fundraiser in the Last Year: Never true  ? Ran Out of Food in the Last Year: Never true  ?Transportation Needs: No Transportation Needs  ? Lack of Transportation (Medical): No  ? Lack of Transportation (Non-Medical): No  ?Physical Activity: Insufficiently Active  ? Days of Exercise per Week: 2 days  ? Minutes of Exercise per Session: 30 min  ?Stress: No Stress Concern Present  ? Feeling of Stress : Not at all  ?Social Connections: Moderately Integrated  ? Frequency of Communication with Friends and Family: More than three times a week  ? Frequency of Social Gatherings with Friends and Family: More than three times a week  ? Attends Religious Services: More than 4 times per year  ? Active Member of Clubs or Organizations: Yes  ? Attends Archivist Meetings: More than 4 times per year  ? Marital Status: Divorced  ?Intimate Partner Violence: Not At Risk  ? Fear of Current or Ex-Partner: No  ? Emotionally Abused: No  ? Physically Abused: No  ? Sexually Abused: No  ? ? ? ?Current Outpatient Medications:  ?  cyclobenzaprine (FLEXERIL) 10 MG tablet, Take 1 tablet (10 mg total) by mouth 3 (three) times daily as needed for muscle spasms., Disp: 30 tablet, Rfl: 0 ?  predniSONE (STERAPRED UNI-PAK 21 TAB) 10 MG (21) TBPK tablet, Take as directed on package.  (60 mg po on day 1, 50 mg po on day 2...), Disp: 21 tablet, Rfl: 0 ?  albuterol (VENTOLIN HFA) 108 (90 Base) MCG/ACT inhaler, 2 puffs q.i.d. p.r.n. short of breath, wheezing, or cough, Disp: 18 g, Rfl: 3 ?  amLODipine (NORVASC) 5 MG tablet, Take 1 tablet (5 mg total) by mouth  daily., Disp: 90 tablet, Rfl: 1 ? ?No Known Allergies ? ? ?ROS ? ?Constitutional: Negative for fever or weight change.  ?Respiratory: Negative for cough and shortness of breath.   ?Cardiovascular: Negative for chest pain or

## 2021-10-17 LAB — COMPLETE METABOLIC PANEL WITH GFR
AG Ratio: 1.7 (calc) (ref 1.0–2.5)
ALT: 32 U/L (ref 9–46)
AST: 24 U/L (ref 10–35)
Albumin: 3.6 g/dL (ref 3.6–5.1)
Alkaline phosphatase (APISO): 50 U/L (ref 35–144)
BUN/Creatinine Ratio: 11 (calc) (ref 6–22)
BUN: 16 mg/dL (ref 7–25)
CO2: 30 mmol/L (ref 20–32)
Calcium: 9.3 mg/dL (ref 8.6–10.3)
Chloride: 104 mmol/L (ref 98–110)
Creat: 1.43 mg/dL — ABNORMAL HIGH (ref 0.70–1.30)
Globulin: 2.1 g/dL (calc) (ref 1.9–3.7)
Glucose, Bld: 73 mg/dL (ref 65–99)
Potassium: 4.3 mmol/L (ref 3.5–5.3)
Sodium: 142 mmol/L (ref 135–146)
Total Bilirubin: 0.3 mg/dL (ref 0.2–1.2)
Total Protein: 5.7 g/dL — ABNORMAL LOW (ref 6.1–8.1)
eGFR: 59 mL/min/{1.73_m2} — ABNORMAL LOW (ref >=60)

## 2021-10-17 LAB — HEPATITIS C ANTIBODY
Hepatitis C Ab: NONREACTIVE
SIGNAL TO CUT-OFF: 0.08 (ref <1.00)

## 2021-10-17 LAB — CBC WITH DIFFERENTIAL/PLATELET
Absolute Monocytes: 536 cells/uL (ref 200–950)
Basophils Absolute: 29 cells/uL (ref 0–200)
Basophils Relative: 0.5 %
Eosinophils Absolute: 217 cells/uL (ref 15–500)
Eosinophils Relative: 3.8 %
HCT: 45.7 % (ref 38.5–50.0)
Hemoglobin: 15.4 g/dL (ref 13.2–17.1)
Lymphs Abs: 1853 cells/uL (ref 850–3900)
MCH: 30.4 pg (ref 27.0–33.0)
MCHC: 33.7 g/dL (ref 32.0–36.0)
MCV: 90.3 fL (ref 80.0–100.0)
MPV: 11.7 fL (ref 7.5–12.5)
Monocytes Relative: 9.4 %
Neutro Abs: 3067 cells/uL (ref 1500–7800)
Neutrophils Relative %: 53.8 %
Platelets: 179 10*3/uL (ref 140–400)
RBC: 5.06 10*6/uL (ref 4.20–5.80)
RDW: 13.5 % (ref 11.0–15.0)
Total Lymphocyte: 32.5 %
WBC: 5.7 10*3/uL (ref 3.8–10.8)

## 2021-10-17 LAB — LIPID PANEL
Cholesterol: 175 mg/dL (ref <200)
HDL: 34 mg/dL — ABNORMAL LOW (ref >=40)
Non-HDL Cholesterol (Calc): 141 mg/dL (calc) — ABNORMAL HIGH (ref <130)
Total CHOL/HDL Ratio: 5.1 (calc) — ABNORMAL HIGH (ref <5.0)
Triglycerides: 410 mg/dL — ABNORMAL HIGH (ref <150)

## 2021-10-17 LAB — PSA: PSA: 0.51 ng/mL (ref <=4.00)

## 2021-10-17 LAB — HEMOGLOBIN A1C
Hgb A1c MFr Bld: 5.2 % of total Hgb (ref <5.7)
Mean Plasma Glucose: 103 mg/dL
eAG (mmol/L): 5.7 mmol/L

## 2021-10-17 LAB — HIV ANTIBODY (ROUTINE TESTING W REFLEX): HIV 1&2 Ab, 4th Generation: NONREACTIVE

## 2021-10-19 ENCOUNTER — Telehealth: Payer: Self-pay

## 2021-10-19 NOTE — Telephone Encounter (Signed)
Patient was unaware referral was sent he is not comfortable scheduling right now closed referral ?

## 2022-01-16 ENCOUNTER — Other Ambulatory Visit: Payer: Self-pay

## 2022-01-16 ENCOUNTER — Ambulatory Visit (INDEPENDENT_AMBULATORY_CARE_PROVIDER_SITE_OTHER): Payer: 59 | Admitting: Nurse Practitioner

## 2022-01-16 ENCOUNTER — Encounter: Payer: Self-pay | Admitting: Nurse Practitioner

## 2022-01-16 VITALS — BP 130/76 | HR 69 | Temp 98.4°F | Resp 16 | Ht 65.0 in | Wt 192.3 lb

## 2022-01-16 DIAGNOSIS — R944 Abnormal results of kidney function studies: Secondary | ICD-10-CM

## 2022-01-16 DIAGNOSIS — J4520 Mild intermittent asthma, uncomplicated: Secondary | ICD-10-CM | POA: Diagnosis not present

## 2022-01-16 DIAGNOSIS — I10 Essential (primary) hypertension: Secondary | ICD-10-CM | POA: Diagnosis not present

## 2022-01-16 DIAGNOSIS — E781 Pure hyperglyceridemia: Secondary | ICD-10-CM | POA: Insufficient documentation

## 2022-01-16 DIAGNOSIS — M791 Myalgia, unspecified site: Secondary | ICD-10-CM

## 2022-01-16 NOTE — Progress Notes (Signed)
BP 130/76   Pulse 69   Temp 98.4 F (36.9 C) (Oral)   Resp 16   Ht $R'5\' 5"'Ty$  (1.651 m)   Wt 192 lb 4.8 oz (87.2 kg)   SpO2 97%   BMI 32.00 kg/m    Subjective:    [name], male    DOB: Jan 29, 1966, 56 y.o.   MRN: [id number]  HPI: Dave Vasquez is a 56 y.o. male  Chief Complaint  Patient presents with   Hypertension   HTN: His blood pressure today is 130/76.  He is currently taking amlodipine 5 mg daily.  He says he does not routinely check his blood pressure.  He denies any chest pain, shortness of breath, headaches or blurred vision.   Asthma: He currently uses an albuterol inhaler for asthma flares. He says he used it yesterday before he mowed the yard.  He has used his inhaler 1 in the last month.   Hypertriglyceridemia: His last triglycerides were 410 on 10/14/2021.  We will get labs today.  Discussed ways to reduce triglycerides in his diet.  Decreased gfr: His last GFR was 59 on 10/14/2021.  Gust with patient to increase water intake and to avoid NSAIDs.  We will get labs today.   Bilateral inner thigh pain: Patient states for about the last 3 months he has had intermittent  bilateral thigh pain.  He describes it like a muscle spasm.  Patient states that when it happens he will go get a spoonful of mustard and that seems to help.  Discussed that with this heat he really needs to increase his fluid intake and electrolytes.  We will get labs today.  Relevant past medical, surgical, family and social history reviewed and updated as indicated. Interim medical history since our last visit reviewed. Allergies and medications reviewed and updated.  Review of Systems  Constitutional: Negative for fever or weight change.  Respiratory: Negative for cough and shortness of breath.   Cardiovascular: Negative for chest pain or palpitations.  Gastrointestinal: Negative for abdominal pain, no bowel changes.  Musculoskeletal: Negative for gait problem or joint swelling.   Skin: Negative for rash.  Neurological: Negative for dizziness or headache.  No other specific complaints in a complete review of systems (except as listed in HPI above).      Objective:    BP 130/76   Pulse 69   Temp 98.4 F (36.9 C) (Oral)   Resp 16   Ht $R'5\' 5"'GN$  (1.651 m)   Wt 192 lb 4.8 oz (87.2 kg)   SpO2 97%   BMI 32.00 kg/m   Wt Readings from Last 3 Encounters:  01/16/22 192 lb 4.8 oz (87.2 kg)  10/14/21 198 lb 9.6 oz (90.1 kg)  01/14/20 188 lb (85.3 kg)    Physical Exam  Constitutional: Patient appears well-developed and well-nourished. Obese  No distress.  HEENT: head atraumatic, normocephalic, pupils equal and reactive to light,  neck supple Cardiovascular: Normal rate, regular rhythm and normal heart sounds.  No murmur heard. No BLE edema. Pulmonary/Chest: Effort normal and breath sounds normal. No respiratory distress. Abdominal: Soft.  There is no tenderness. Psychiatric: Patient has a normal mood and affect. behavior is normal. Judgment and thought content normal.  Results for orders placed or performed in visit on 10/14/21  Lipid panel  Result Value Ref Range   Cholesterol 175 <200 mg/dL   HDL 34 (L) > OR = 40 mg/dL   Triglycerides 410 (H) <150 mg/dL  LDL Cholesterol (Calc)  mg/dL (calc)   Total CHOL/HDL Ratio 5.1 (H) <5.0 (calc)   Non-HDL Cholesterol (Calc) 141 (H) <130 mg/dL (calc)  CBC with Differential/Platelet  Result Value Ref Range   WBC 5.7 3.8 - 10.8 Thousand/uL   RBC 5.06 4.20 - 5.80 Million/uL   Hemoglobin 15.4 13.2 - 17.1 g/dL   HCT 45.7 38.5 - 50.0 %   MCV 90.3 80.0 - 100.0 fL   MCH 30.4 27.0 - 33.0 pg   MCHC 33.7 32.0 - 36.0 g/dL   RDW 13.5 11.0 - 15.0 %   Platelets 179 140 - 400 Thousand/uL   MPV 11.7 7.5 - 12.5 fL   Neutro Abs 3,067 1,500 - 7,800 cells/uL   Lymphs Abs 1,853 850 - 3,900 cells/uL   Absolute Monocytes 536 200 - 950 cells/uL   Eosinophils Absolute 217 15 - 500 cells/uL   Basophils Absolute 29 0 - 200 cells/uL    Neutrophils Relative % 53.8 %   Total Lymphocyte 32.5 %   Monocytes Relative 9.4 %   Eosinophils Relative 3.8 %   Basophils Relative 0.5 %  COMPLETE METABOLIC PANEL WITH GFR  Result Value Ref Range   Glucose, Bld 73 65 - 99 mg/dL   BUN 16 7 - 25 mg/dL   Creat 1.43 (H) 0.70 - 1.30 mg/dL   eGFR 59 (L) > OR = 60 mL/min/1.75m2   BUN/Creatinine Ratio 11 6 - 22 (calc)   Sodium 142 135 - 146 mmol/L   Potassium 4.3 3.5 - 5.3 mmol/L   Chloride 104 98 - 110 mmol/L   CO2 30 20 - 32 mmol/L   Calcium 9.3 8.6 - 10.3 mg/dL   Total Protein 5.7 (L) 6.1 - 8.1 g/dL   Albumin 3.6 3.6 - 5.1 g/dL   Globulin 2.1 1.9 - 3.7 g/dL (calc)   AG Ratio 1.7 1.0 - 2.5 (calc)   Total Bilirubin 0.3 0.2 - 1.2 mg/dL   Alkaline phosphatase (APISO) 50 35 - 144 U/L   AST 24 10 - 35 U/L   ALT 32 9 - 46 U/L  Hemoglobin A1c  Result Value Ref Range   Hgb A1c MFr Bld 5.2 <5.7 % of total Hgb   Mean Plasma Glucose 103 mg/dL   eAG (mmol/L) 5.7 mmol/L  HIV Antibody (routine testing w rflx)  Result Value Ref Range   HIV 1&2 Ab, 4th Generation NON-REACTIVE NON-REACTIVE  Hepatitis C antibody  Result Value Ref Range   Hepatitis C Ab NON-REACTIVE NON-REACTIVE   SIGNAL TO CUT-OFF 0.08 <1.00  PSA  Result Value Ref Range   PSA 0.51 < OR = 4.00 ng/mL      Assessment & Plan:   Problem List Items Addressed This Visit       Cardiovascular and Mediastinum   Essential hypertension - Primary    Continue taking amlodipine 5 mg daily.  Blood pressure 130/76 today.      Relevant Orders   CBC with Differential/Platelet   COMPLETE METABOLIC PANEL WITH GFR     Respiratory   Asthma, mild    Continue using albuterol inhaler as needed.        Other   Hypertriglyceridemia    Discussed ways to decrease triglycerides in his diet.  Avoid saturated fats, red meat and fried foods.      Relevant Orders   Lipid panel   Other Visit Diagnoses     Decreased GFR       Avoid NSAIDs.  Increase fluids.  We will  get labs today.    Relevant Orders   COMPLETE METABOLIC PANEL WITH GFR   Muscle pain       Increase water and electrolyte intake.  We will get labs.   Relevant Orders   COMPLETE METABOLIC PANEL WITH GFR   Magnesium        Follow up plan: Return in about 3 months (around 04/18/2022) for follow up.

## 2022-01-16 NOTE — Assessment & Plan Note (Signed)
Discussed ways to decrease triglycerides in his diet.  Avoid saturated fats, red meat and fried foods.

## 2022-01-16 NOTE — Assessment & Plan Note (Signed)
Continue taking amlodipine 5 mg daily.  Blood pressure 130/76 today.

## 2022-01-16 NOTE — Assessment & Plan Note (Signed)
Continue using albuterol inhaler as needed.  

## 2022-01-17 ENCOUNTER — Other Ambulatory Visit: Payer: Self-pay | Admitting: Nurse Practitioner

## 2022-01-17 LAB — CBC WITH DIFFERENTIAL/PLATELET
Absolute Monocytes: 723 cells/uL (ref 200–950)
Basophils Absolute: 38 cells/uL (ref 0–200)
Basophils Relative: 0.6 %
Eosinophils Absolute: 179 cells/uL (ref 15–500)
Eosinophils Relative: 2.8 %
HCT: 44.9 % (ref 38.5–50.0)
Hemoglobin: 15.5 g/dL (ref 13.2–17.1)
Lymphs Abs: 1843 cells/uL (ref 850–3900)
MCH: 30.5 pg (ref 27.0–33.0)
MCHC: 34.5 g/dL (ref 32.0–36.0)
MCV: 88.2 fL (ref 80.0–100.0)
MPV: 11.6 fL (ref 7.5–12.5)
Monocytes Relative: 11.3 %
Neutro Abs: 3616 cells/uL (ref 1500–7800)
Neutrophils Relative %: 56.5 %
Platelets: 181 10*3/uL (ref 140–400)
RBC: 5.09 10*6/uL (ref 4.20–5.80)
RDW: 12.9 % (ref 11.0–15.0)
Total Lymphocyte: 28.8 %
WBC: 6.4 10*3/uL (ref 3.8–10.8)

## 2022-01-17 LAB — COMPLETE METABOLIC PANEL WITH GFR
AG Ratio: 2 (calc) (ref 1.0–2.5)
ALT: 27 U/L (ref 9–46)
AST: 24 U/L (ref 10–35)
Albumin: 3.8 g/dL (ref 3.6–5.1)
Alkaline phosphatase (APISO): 50 U/L (ref 35–144)
BUN/Creatinine Ratio: 11 (calc) (ref 6–22)
BUN: 15 mg/dL (ref 7–25)
CO2: 33 mmol/L — ABNORMAL HIGH (ref 20–32)
Calcium: 8.9 mg/dL (ref 8.6–10.3)
Chloride: 106 mmol/L (ref 98–110)
Creat: 1.39 mg/dL — ABNORMAL HIGH (ref 0.70–1.30)
Globulin: 1.9 g/dL (calc) (ref 1.9–3.7)
Glucose, Bld: 105 mg/dL — ABNORMAL HIGH (ref 65–99)
Potassium: 3.6 mmol/L (ref 3.5–5.3)
Sodium: 143 mmol/L (ref 135–146)
Total Bilirubin: 0.5 mg/dL (ref 0.2–1.2)
Total Protein: 5.7 g/dL — ABNORMAL LOW (ref 6.1–8.1)
eGFR: 61 mL/min/{1.73_m2} (ref >=60)

## 2022-01-17 LAB — LIPID PANEL
Cholesterol: 183 mg/dL (ref <200)
HDL: 35 mg/dL — ABNORMAL LOW (ref >=40)
LDL Cholesterol (Calc): 115 mg/dL (calc) — ABNORMAL HIGH
Non-HDL Cholesterol (Calc): 148 mg/dL (calc) — ABNORMAL HIGH (ref <130)
Total CHOL/HDL Ratio: 5.2 (calc) — ABNORMAL HIGH (ref <5.0)
Triglycerides: 214 mg/dL — ABNORMAL HIGH (ref <150)

## 2022-01-17 LAB — MAGNESIUM: Magnesium: 2.2 mg/dL (ref 1.5–2.5)

## 2022-01-18 ENCOUNTER — Other Ambulatory Visit: Payer: Self-pay | Admitting: Nurse Practitioner

## 2022-01-18 DIAGNOSIS — E782 Mixed hyperlipidemia: Secondary | ICD-10-CM

## 2022-01-18 MED ORDER — ATORVASTATIN CALCIUM 10 MG PO TABS
10.0000 mg | ORAL_TABLET | Freq: Every day | ORAL | 3 refills | Status: DC
Start: 2022-01-18 — End: 2023-05-17

## 2022-04-06 ENCOUNTER — Other Ambulatory Visit: Payer: Self-pay | Admitting: Nurse Practitioner

## 2022-04-06 DIAGNOSIS — I10 Essential (primary) hypertension: Secondary | ICD-10-CM

## 2022-04-06 NOTE — Telephone Encounter (Signed)
Requested Prescriptions  Pending Prescriptions Disp Refills  . amLODipine (NORVASC) 5 MG tablet [Pharmacy Med Name: AMLODIPINE BESYLATE 5 MG TAB] 90 tablet 0    Sig: TAKE 1 TABLET (5 MG TOTAL) BY MOUTH DAILY.     Cardiovascular: Calcium Channel Blockers 2 Passed - 04/06/2022  2:40 AM      Passed - Last BP in normal range    BP Readings from Last 1 Encounters:  01/16/22 130/76         Passed - Last Heart Rate in normal range    Pulse Readings from Last 1 Encounters:  01/16/22 69         Passed - Valid encounter within last 6 months    Recent Outpatient Visits          2 months ago Essential hypertension   [place], FNP   5 months ago Annual physical exam   [facility] Bo Merino, FNP      Future Appointments            In 1 week Reece Packer, Myna Hidalgo, [facility], [facility]

## 2022-04-07 DIAGNOSIS — D1721 Benign lipomatous neoplasm of skin and subcutaneous tissue of right arm: Secondary | ICD-10-CM | POA: Diagnosis not present

## 2022-04-19 ENCOUNTER — Ambulatory Visit: Payer: 59 | Admitting: Nurse Practitioner

## 2022-04-20 ENCOUNTER — Ambulatory Visit (INDEPENDENT_AMBULATORY_CARE_PROVIDER_SITE_OTHER): Payer: 59 | Admitting: Nurse Practitioner

## 2022-04-20 ENCOUNTER — Other Ambulatory Visit: Payer: Self-pay

## 2022-04-20 ENCOUNTER — Encounter: Payer: Self-pay | Admitting: Nurse Practitioner

## 2022-04-20 VITALS — BP 138/84 | HR 75 | Temp 97.7°F | Resp 18 | Ht 65.0 in | Wt 191.0 lb

## 2022-04-20 DIAGNOSIS — Z23 Encounter for immunization: Secondary | ICD-10-CM

## 2022-04-20 DIAGNOSIS — E781 Pure hyperglyceridemia: Secondary | ICD-10-CM | POA: Diagnosis not present

## 2022-04-20 DIAGNOSIS — I10 Essential (primary) hypertension: Secondary | ICD-10-CM

## 2022-04-20 DIAGNOSIS — R944 Abnormal results of kidney function studies: Secondary | ICD-10-CM | POA: Diagnosis not present

## 2022-04-20 DIAGNOSIS — E782 Mixed hyperlipidemia: Secondary | ICD-10-CM | POA: Diagnosis not present

## 2022-04-20 DIAGNOSIS — J4520 Mild intermittent asthma, uncomplicated: Secondary | ICD-10-CM

## 2022-04-20 DIAGNOSIS — D1721 Benign lipomatous neoplasm of skin and subcutaneous tissue of right arm: Secondary | ICD-10-CM

## 2022-04-20 MED ORDER — AMLODIPINE BESYLATE 5 MG PO TABS: 5.0000 mg | ORAL_TABLET | Freq: Every day | ORAL | 1 refills | Status: DC

## 2022-04-20 NOTE — Assessment & Plan Note (Signed)
Patient reports he has had no issues with his breathing.  Continue to use albuterol inhaler as needed.

## 2022-04-20 NOTE — Assessment & Plan Note (Signed)
Continue taking atorvastatin 10 mg daily 

## 2022-04-20 NOTE — Progress Notes (Signed)
BP 138/84   Pulse 75   Temp 97.7 F (36.5 C) (Oral)   Resp 18   Ht $R'5\' 5"'Rw$  (1.651 m)   Wt 191 lb (86.6 kg)   SpO2 98%   BMI 31.78 kg/m    Subjective:    [name], male    DOB: [birth year], 56 y.o.   MRN: [id number]  HPI: Dave Vasquez is a 56 y.o. male  Chief Complaint  Patient presents with   Hypertension   Hyperlipidemia   Referral    Surgeon for right arm pain. Seen at [facility]   HTN: His blood pressure today is 138/84.  He denies any chest pain, headaches, shortness of breath or blurred vision.  He currently takes amlodipine 5 mg daily.   Asthma: He reports that he uses his albuterol inhaler when he has an asthma flare.  Patient states he has not needed to use his inhaler recently.  Hypertriglyceridemia/hyperlipidemia: His last triglycerides were 214 on 01/16/2022.  His last LDL was 115.  Patient was started on atorvastatin 10 mg daily.  Patient reports he does take it every day.  He denies any myalgia.  Decreased gfr: Last GFR was 61 on 01/16/2022 prior to that it was 59 on 10/14/2021.  Patient is aware to avoid NSAIDs.   Lipoma of right upper extremity: Patient was seen at urgent care on 04/07/2022. Patient states he was told he had a lipoma and that he would need to see someone to have it removed.  Patient would like to have it removed. He says it is decreasing his range of motion. He says that he noticed after doing some flooring.    Relevant past medical, surgical, family and social history reviewed and updated as indicated. Interim medical history since our last visit reviewed. Allergies and medications reviewed and updated.  Review of Systems  Constitutional: Negative for fever or weight change.  Respiratory: Negative for cough and shortness of breath.   Cardiovascular: Negative for chest pain or palpitations.  Gastrointestinal: Negative for abdominal pain, no bowel changes.  Musculoskeletal: Negative for gait problem or joint swelling.  Skin: Negative  for rash.  Neurological: Negative for dizziness or headache.  No other specific complaints in a complete review of systems (except as listed in HPI above).      Objective:    BP 138/84   Pulse 75   Temp 97.7 F (36.5 C) (Oral)   Resp 18   Ht $R'5\' 5"'lz$  (1.651 m)   Wt 191 lb (86.6 kg)   SpO2 98%   BMI 31.78 kg/m   Wt Readings from Last 3 Encounters:  04/20/22 191 lb (86.6 kg)  01/16/22 192 lb 4.8 oz (87.2 kg)  10/14/21 198 lb 9.6 oz (90.1 kg)    Physical Exam  Constitutional: Patient appears well-developed and well-nourished. Obese  No distress.  HEENT: head atraumatic, normocephalic, pupils equal and reactive to light,  neck supple Cardiovascular: Normal rate, regular rhythm and normal heart sounds.  No murmur heard. No BLE edema. Pulmonary/Chest: Effort normal and breath sounds normal. No respiratory distress. Abdominal: Soft.  There is no tenderness. Psychiatric: Patient has a normal mood and affect. behavior is normal. Judgment and thought content normal.  Results for orders placed or performed in visit on 01/16/22  Lipid panel  Result Value Ref Range   Cholesterol 183 <200 mg/dL   HDL 35 (L) > OR = 40 mg/dL   Triglycerides 214 (H) <150 mg/dL   LDL Cholesterol (Calc)  115 (H) mg/dL (calc)   Total CHOL/HDL Ratio 5.2 (H) <5.0 (calc)   Non-HDL Cholesterol (Calc) 148 (H) <130 mg/dL (calc)  CBC with Differential/Platelet  Result Value Ref Range   WBC 6.4 3.8 - 10.8 Thousand/uL   RBC 5.09 4.20 - 5.80 Million/uL   Hemoglobin 15.5 13.2 - 17.1 g/dL   HCT 44.9 38.5 - 50.0 %   MCV 88.2 80.0 - 100.0 fL   MCH 30.5 27.0 - 33.0 pg   MCHC 34.5 32.0 - 36.0 g/dL   RDW 12.9 11.0 - 15.0 %   Platelets 181 140 - 400 Thousand/uL   MPV 11.6 7.5 - 12.5 fL   Neutro Abs 3,616 1,500 - 7,800 cells/uL   Lymphs Abs 1,843 850 - 3,900 cells/uL   Absolute Monocytes 723 200 - 950 cells/uL   Eosinophils Absolute 179 15 - 500 cells/uL   Basophils Absolute 38 0 - 200 cells/uL   Neutrophils Relative %  56.5 %   Total Lymphocyte 28.8 %   Monocytes Relative 11.3 %   Eosinophils Relative 2.8 %   Basophils Relative 0.6 %  COMPLETE METABOLIC PANEL WITH GFR  Result Value Ref Range   Glucose, Bld 105 (H) 65 - 99 mg/dL   BUN 15 7 - 25 mg/dL   Creat 1.39 (H) 0.70 - 1.30 mg/dL   eGFR 61 > OR = 60 mL/min/1.54m2   BUN/Creatinine Ratio 11 6 - 22 (calc)   Sodium 143 135 - 146 mmol/L   Potassium 3.6 3.5 - 5.3 mmol/L   Chloride 106 98 - 110 mmol/L   CO2 33 (H) 20 - 32 mmol/L   Calcium 8.9 8.6 - 10.3 mg/dL   Total Protein 5.7 (L) 6.1 - 8.1 g/dL   Albumin 3.8 3.6 - 5.1 g/dL   Globulin 1.9 1.9 - 3.7 g/dL (calc)   AG Ratio 2.0 1.0 - 2.5 (calc)   Total Bilirubin 0.5 0.2 - 1.2 mg/dL   Alkaline phosphatase (APISO) 50 35 - 144 U/L   AST 24 10 - 35 U/L   ALT 27 9 - 46 U/L  Magnesium  Result Value Ref Range   Magnesium 2.2 1.5 - 2.5 mg/dL      Assessment & Plan:   Problem List Items Addressed This Visit       Cardiovascular and Mediastinum   Essential hypertension - Primary    Continue taking amlodipine 5 mg daily.  Monitor salt intake.      Relevant Medications   amLODipine (NORVASC) 5 MG tablet     Respiratory   Mild intermittent asthma without complication    Patient reports he has had no issues with his breathing.  Continue to use albuterol inhaler as needed.        Other   Hypertriglyceridemia    Continue taking atorvastatin 10 mg daily      Relevant Medications   amLODipine (NORVASC) 5 MG tablet   Mixed hyperlipidemia    Continue taking atorvastatin 10 mg daily.      Relevant Medications   amLODipine (NORVASC) 5 MG tablet   Other Visit Diagnoses     Need for influenza vaccination       Relevant Orders   Flu Vaccine QUAD 6+ mos PF IM (Fluarix Quad PF) (Completed)   Need for Tdap vaccination       Relevant Orders   Tdap vaccine greater than or equal to 7yo IM (Completed)   Lipoma of right upper extremity       Referral  placed  to surgery for removal.   Relevant  Orders   Ambulatory referral to General Surgery   Decreased GFR       GFR improved        Follow up plan: Return in about 6 months (around 10/20/2022) for follow up.

## 2022-04-20 NOTE — Assessment & Plan Note (Signed)
Continue taking amlodipine 5 mg daily.  Monitor salt intake.

## 2022-04-27 ENCOUNTER — Ambulatory Visit: Payer: 59 | Admitting: Surgery

## 2022-04-27 ENCOUNTER — Other Ambulatory Visit: Payer: Self-pay

## 2022-04-27 ENCOUNTER — Encounter: Payer: Self-pay | Admitting: Surgery

## 2022-04-27 VITALS — BP 154/81 | HR 60 | Temp 98.2°F | Ht 65.0 in | Wt 189.0 lb

## 2022-04-27 DIAGNOSIS — M7989 Other specified soft tissue disorders: Secondary | ICD-10-CM

## 2022-04-27 DIAGNOSIS — D1721 Benign lipomatous neoplasm of skin and subcutaneous tissue of right arm: Secondary | ICD-10-CM | POA: Diagnosis not present

## 2022-04-27 NOTE — Patient Instructions (Addendum)
Do not shower for 24 hours.     Lipoma Removal  Lipoma removal is a surgical procedure to remove a lipoma, which is a noncancerous (benign) tumor that is made up of fat cells. Most lipomas are small and painless and do not require treatment. They can form in many areas of the body but are most common under the skin of the back, arms, shoulders, buttocks, and thighs. You may need lipoma removal if you have a lipoma that is large, growing, or causing discomfort. Lipoma removal may also be done for cosmetic reasons. Tell a health care provider about: Any allergies you have. All medicines you are taking, including vitamins, herbs, eye drops, creams, and over-the-counter medicines. Any problems you or family members have had with anesthetic medicines. Any bleeding problems you have. Any surgeries you have had. Any medical conditions you have. Whether you are pregnant or may be pregnant. What are the risks? Generally, this is a safe procedure. However, problems may occur, including: Infection. Bleeding. Scarring. Allergic reactions to medicines. Damage to nearby structures or organs, such as damage to nerves or blood vessels near the lipoma. What happens before the procedure? When to Stop Eating and Drinking Follow instructions from your health care provider about what you may eat and drink before your procedure. These may include: 8 hours before your procedure Stop eating most foods. Do not eat meat, fried foods, or fatty foods. Eat only light foods, such as toast or crackers. All liquids are okay except energy drinks and alcohol. 6 hours before your procedure Stop eating. Drink only clear liquids, such as water, clear fruit juice, black coffee, plain tea, and sports drinks. Do not drink energy drinks or alcohol. 2 hours before your procedure Stop drinking all liquids. You may be allowed to take medicines with small sips of water. If you do not follow your health care provider's  instructions, your procedure may be delayed or canceled. Medicines Ask your health care provider about: Changing or stopping your regular medicines. This is especially important if you are taking diabetes medicines or blood thinners. Taking medicines such as aspirin and ibuprofen. These medicines can thin your blood. Do not take these medicines unless your health care provider tells you to take them. Taking over-the-counter medicines, vitamins, herbs, and supplements. General instructions You will have a physical exam. Your health care provider will check the size of the lipoma and whether it can be removed easily. You may have a biopsy and imaging tests, such as X-rays, a CT scan, and an MRI. Do not use any products that contain nicotine or tobacco for at least 4 weeks before the procedure. These products include cigarettes, chewing tobacco, and vaping devices, such as e-cigarettes. If you need help quitting, ask your health care provider. Ask your health care provider: How your surgery site will be marked. What steps will be taken to help prevent infection. These may include: Washing skin with a germ-killing soap. Taking antibiotic medicine. If you will be going home right after the procedure, plan to have a responsible adult: Take you home from the hospital or clinic. You will not be allowed to drive. Care for you for the time you are told. What happens during the procedure?  An IV will be inserted into one of your veins. You will be given one or more of the following: A medicine to help you relax (sedative). A medicine to numb the area (local anesthetic). A medicine to make you fall asleep (general anesthetic). A medicine  that is injected into an area of your body to numb everything below the injection site (regional anesthetic). An incision will be made into the skin over the lipoma or very near the lipoma. The incision may be made in a natural skin line or crease. Tissues, nerves,  and blood vessels near the lipoma will be moved out of the way. The lipoma and the capsule that surrounds it will be separated from the surrounding tissues. The lipoma will be removed. The incision may be closed with stitches (sutures). A bandage (dressing) will be placed over the incision. The procedure may vary among health care providers and hospitals. What happens after the procedure? Your blood pressure, heart rate, breathing rate, and blood oxygen level will be monitored until you leave the hospital or clinic. If you were prescribed an antibiotic medicine, use it as told by your health care provider. Do not stop using the antibiotic even if you start to feel better. If you were given a sedative during the procedure, it can affect you for several hours. Do not drive or operate machinery until your health care provider says that it is safe. Where to find more information OrthoInfo: orthoinfo.aaos.org Summary Before the procedure, follow instructions from your health care provider about eating and drinking, and changing or stopping your regular medicines. This is especially important if you are taking diabetes medicines or blood thinners. After the lipoma is removed, the incision may be closed with stitches (sutures) and covered with a bandage (dressing). If you were given a sedative during the procedure, it can affect you for several hours. Do not drive or operate machinery until your health care provider says that it is safe. This information is not intended to replace advice given to you by your health care provider. Make sure you discuss any questions you have with your health care provider. Document Revised: 07/08/2021 Document Reviewed: 07/08/2021 Elsevier Patient Education  [place].

## 2022-04-27 NOTE — Progress Notes (Signed)
[name], male   DOB: 10/04/1965, 56 y.o.   MRN: [id number]  Chief Complaint: Right antecubital fossa mass,  History of Present Illness Dave Vasquez is a 56 y.o. male with a knot that has developed in the medial right antecubital fossa.  Patient has been doing a lot of floor work, Tax adviser.  Has had some strain/tendinitis of the distal forearm.  He had come to a point where it was painful challenging even to bend his elbow to such a degree as to reach the back of his neck or back of his head.  It seems that there is just chest enough of a soft tissue mass that is preventing complete flexion of the right arm at the elbow.  There seems to be some impingement/tenderness there.  Has known distal forearm issues with work strain.  Past Medical History Past Medical History:  Diagnosis Date   Asthma    Hypertension       History reviewed. No pertinent surgical history.  Allergies  Allergen Reactions   No Known Allergies     Current Outpatient Medications  Medication Sig Dispense Refill   albuterol (VENTOLIN HFA) 108 (90 Base) MCG/ACT inhaler 2 puffs q.i.d. p.r.n. short of breath, wheezing, or cough 18 g 3   amLODipine (NORVASC) 5 MG tablet Take 1 tablet (5 mg total) by mouth daily. 90 tablet 1   atorvastatin (LIPITOR) 10 MG tablet Take 1 tablet (10 mg total) by mouth daily. 90 tablet 3   No current facility-administered medications for this visit.    Family History Family History  Problem Relation Age of Onset   Diabetes Mother       Social History Social History   Tobacco Use   Smoking status: Never   Smokeless tobacco: Never  Vaping Use   Vaping Use: Never used  Substance Use Topics   Alcohol use: No   Drug use: No        Review of Systems  Constitutional: Negative.   HENT: Negative.    Eyes: Negative.   Respiratory: Negative.    Cardiovascular: Negative.   Gastrointestinal: Negative.   Genitourinary: Negative.   Skin: Negative.    Neurological: Negative.   Psychiatric/Behavioral: Negative.        Physical Exam Blood pressure (!) 154/81, pulse 60, temperature 98.2 F (36.8 C), temperature source Oral, height '5\' 5"'$  (1.651 m), weight 189 lb (85.7 kg), SpO2 96 %. Last Weight  Most recent update: 04/27/2022 10:24 AM    Weight  85.7 kg (189 lb)             CONSTITUTIONAL: Well developed, and nourished, appropriately responsive and aware without distress.   EYES: Sclera non-icteric.   EARS, NOSE, MOUTH AND THROAT:  The oropharynx is clear. Oral mucosa is pink and moist.    Hearing is intact to voice.  NECK: Trachea is midline, and there is no jugular venous distension.  LYMPH NODES:  Lymph nodes in the neck are not enlarged. RESPIRATORY:  Lungs are clear, and breath sounds are equal bilaterally. Normal respiratory effort without pathologic use of accessory muscles. CARDIOVASCULAR: Heart is regular in rate and rhythm. GI: The abdomen is soft, nontender, and nondistended. There were no palpable masses. I did not appreciate hepatosplenomegaly. There were normal bowel sounds. MUSCULOSKELETAL:  Symmetrical muscle tone appreciated in all four extremities.    On the medial aspect of his right antecubital fossa there is a palpable soft tissue mass consistent with a lipoma.  The mass measures approximately 2.4 cm.  It feels immediately subdermal, seems mobile but is somewhat tethered.  Seems to be a potential obstruction to his full flexion.  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  SKIN: Skin turgor is normal. No pathologic skin lesions appreciated.  NEUROLOGIC:  Motor and sensation appear grossly normal.  Cranial nerves are grossly without defect. PSYCH:  Alert and oriented to person, place and time. Affect is appropriate for situation.  Data Reviewed I have personally reviewed what is currently available of the patient's  imaging, recent labs and medical records.   Labs:     Latest Ref Rng & Units 01/16/2022    8:46 AM 10/14/2021   10:00 AM 11/24/2016   11:50 AM  CBC  WBC 3.8 - 10.8 Thousand/uL 6.4  5.7  6.7   Hemoglobin 13.2 - 17.1 g/dL 15.5  15.4  14.8   Hematocrit 38.5 - 50.0 % 44.9  45.7  42.9   Platelets 140 - 400 Thousand/uL 181  179  166       Latest Ref Rng & Units 01/16/2022    8:46 AM 10/14/2021   10:00 AM 11/24/2016   11:50 AM  CMP  Glucose 65 - 99 mg/dL 105  73  104   BUN 7 - 25 mg/dL '15  16  16   '$ Creatinine 0.70 - 1.30 mg/dL 1.39  1.43  1.20   Sodium 135 - 146 mmol/L 143  142  139   Potassium 3.5 - 5.3 mmol/L 3.6  4.3  4.3   Chloride 98 - 110 mmol/L 106  104  103   CO2 20 - 32 mmol/L 33  30  31   Calcium 8.6 - 10.3 mg/dL 8.9  9.3  9.2   Total Protein 6.1 - 8.1 g/dL 5.7  5.7    Total Bilirubin 0.2 - 1.2 mg/dL 0.5  0.3    AST 10 - 35 U/L 24  24    ALT 9 - 46 U/L 27  32       Imaging:  Within last 24 hrs: No results found.  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 Patient Active Problem List   Diagnosis Date Noted   Mixed hyperlipidemia 04/20/2022   Hypertriglyceridemia 01/16/2022   Essential hypertension 02/26/2017   Mild intermittent asthma without complication 09/60/4540   Excision of right medial 2.4 cm soft tissue mass.  Pre-operative Diagnosis: 2.4 cm soft tissue mass right medial antecubital fossa, suspect benign mature lipoma.  Post-operative Diagnosis: same.    Surgeon: Ronny Bacon, M.D., FACS  Anesthesia: Local   Findings: Multilobulated lipomatous mass, with some deep tethering.  Estimated Blood Loss: 5 mL         Specimens: Multi lipomatous mature adipose tissue consistent with lipoma, discarded.          Complications: none              Procedure Details  The patient was evaluated, the benefits, complications, treatment options, and expected outcomes were discussed with the patient. The  risks of bleeding, infection, recurrence of symptoms, failure to resolve symptoms, unanticipated injury, any of which could require further surgery were reviewed with the patient. The likelihood of improving the patient's symptoms with return to their baseline status is expected.  The patient and/or family concurred with the proposed plan, giving informed consent.  The patient was taken to our procedure room, identified and the procedure verified.    The patient was positioned in the supine position and the right arm was prepped with  Chloraprep and draped in the sterile fashion.  A Time Out was held and the above information confirmed.  Local infiltration 1% lidocaine with epinephrine is applied overlying the mass, a longitudinal incision is made over the mass.  Blunt and sharp dissection was utilized to mobilize the lipomatous tissues in this area which created the mass effect.  These were sharply and carefully excised with intent to avoid any underlying nerves or blood vessels.  No antecubital veins were encountered.  Excellent hemostasis prior to closure.  The skin was then closed with interrupted 3-0 Vicryl and interrupted 4-0 Monocryl subcuticular's.  The incision was sealed with Dermabond.   Plan    Follow-up in 7 to 10 days for wound check.  Or return as needed.  Face-to-face time spent with the patient and accompanying care providers(if present) was 30 minutes, with more than 50% of the time spent counseling, educating, and coordinating care of the patient.    These notes generated with voice recognition software. I apologize for typographical errors.  Ronny Bacon M.D., FACS 04/27/2022, 12:41 PM

## 2022-05-03 NOTE — Progress Notes (Signed)
[facility] POST-OP OFFICE VISIT  05/04/2022  HPI: Dave Vasquez is a 56 y.o. male 7 days s/p excision of right medial antecubital fossa lipoma.  His range of motion is significantly improved.  He does have some resolving ecchymosis that spreads distally on his medial forearm and cephalad, but no evidence of hematoma.  He denies pain or tingling down his arm or in the area at all.  He is quite pleased.  Vital signs: BP 134/81   Pulse (!) 55   Temp 98.3 F (36.8 C) (Oral)   Ht '5\' 5"'$  (1.651 m)   Wt 189 lb (85.7 kg)   SpO2 96%   BMI 31.45 kg/m    Physical Exam: Constitutional: He appears well  Skin: As reported in the HPI.  Assessment/Plan: This is a 56 y.o. male 7 days s/p excision of right antecubital lipoma.  Patient Active Problem List   Diagnosis Date Noted   Mass of soft tissue of upper arm 04/27/2022   Mixed hyperlipidemia 04/20/2022   Hypertriglyceridemia 01/16/2022   Essential hypertension 02/26/2017   Mild intermittent asthma without complication 70/07/7492    -Follow-up as needed.   Ronny Bacon M.D., FACS 05/04/2022, 8:58 AM

## 2022-05-04 ENCOUNTER — Ambulatory Visit (INDEPENDENT_AMBULATORY_CARE_PROVIDER_SITE_OTHER): Payer: 59 | Admitting: Surgery

## 2022-05-04 ENCOUNTER — Other Ambulatory Visit: Payer: Self-pay

## 2022-05-04 ENCOUNTER — Encounter: Payer: Self-pay | Admitting: Surgery

## 2022-05-04 VITALS — BP 134/81 | HR 55 | Temp 98.3°F | Ht 65.0 in | Wt 189.0 lb

## 2022-05-04 DIAGNOSIS — Z09 Encounter for follow-up examination after completed treatment for conditions other than malignant neoplasm: Secondary | ICD-10-CM

## 2022-05-04 DIAGNOSIS — D1721 Benign lipomatous neoplasm of skin and subcutaneous tissue of right arm: Secondary | ICD-10-CM

## 2022-05-04 DIAGNOSIS — M7989 Other specified soft tissue disorders: Secondary | ICD-10-CM

## 2022-05-04 NOTE — Patient Instructions (Signed)
Please call the office if you have any questions or concerns.

## 2022-05-30 ENCOUNTER — Ambulatory Visit: Payer: 59 | Admitting: Surgery

## 2022-10-23 ENCOUNTER — Ambulatory Visit: Payer: 59 | Admitting: Nurse Practitioner

## 2022-10-23 ENCOUNTER — Telehealth: Payer: Self-pay | Admitting: Nurse Practitioner

## 2022-10-23 NOTE — Telephone Encounter (Signed)
Erroneous encounter

## 2022-10-25 NOTE — Progress Notes (Unsigned)
There were no vitals taken for this visit.   Subjective:    [name], male    DOB: [birth year], 57 y.o.   MRN: [id number]  HPI: Dave Vasquez is a 57 y.o. male  No chief complaint on file.  HTN: Blood pressure today is ***.  Currently takes amlodipine 5 mg daily.  He denies any headaches, shortness of breath, blurred vision or chest pain.  Asthma: Patient uses an albuterol inhaler when he has an asthma flare.  He reports ***.   Hypertriglyceridemia/hyperlipidemia: his last LDL was 115 on 01/16/2022.  Reports he takes atorvastatin 10 mg daily. The 10-year ASCVD risk score (Arnett DK, et al., 2019) is: 12.3%   Values used to calculate the score:     Age: 82 years     Sex: Male     Is Non-Hispanic African American: Yes     Diabetic: No     Tobacco smoker: No     Systolic Blood Pressure: 134 mmHg     Is BP treated: Yes     HDL Cholesterol: 35 mg/dL     Total Cholesterol: 183 mg/dL   Decreased gfr: Last GFR was 61 on 01/16/2022 prior to that it was 59 on 10/14/2021.  Patient due for labs.   Relevant past medical, surgical, family and social history reviewed and updated as indicated. Interim medical history since our last visit reviewed. Allergies and medications reviewed and updated.  Review of Systems  Constitutional: Negative for fever or weight change.  Respiratory: Negative for cough and shortness of breath.   Cardiovascular: Negative for chest pain or palpitations.  Gastrointestinal: Negative for abdominal pain, no bowel changes.  Musculoskeletal: Negative for gait problem or joint swelling.  Skin: Negative for rash.  Neurological: Negative for dizziness or headache.  No other specific complaints in a complete review of systems (except as listed in HPI above).      Objective:    There were no vitals taken for this visit.  Wt Readings from Last 3 Encounters:  05/04/22 189 lb (85.7 kg)  04/27/22 189 lb (85.7 kg)  04/20/22 191 lb (86.6 kg)    Physical  Exam  Constitutional: Patient appears well-developed and well-nourished. Obese  No distress.  HEENT: head atraumatic, normocephalic, pupils equal and reactive to light,  neck supple Cardiovascular: Normal rate, regular rhythm and normal heart sounds.  No murmur heard. No BLE edema. Pulmonary/Chest: Effort normal and breath sounds normal. No respiratory distress. Abdominal: Soft.  There is no tenderness. Psychiatric: Patient has a normal mood and affect. behavior is normal. Judgment and thought content normal.  Results for orders placed or performed in visit on 01/16/22  Lipid panel  Result Value Ref Range   Cholesterol 183 <200 mg/dL   HDL 35 (L) > OR = 40 mg/dL   Triglycerides 409 (H) <150 mg/dL   LDL Cholesterol (Calc) 115 (H) mg/dL (calc)   Total CHOL/HDL Ratio 5.2 (H) <5.0 (calc)   Non-HDL Cholesterol (Calc) 148 (H) <130 mg/dL (calc)  CBC with Differential/Platelet  Result Value Ref Range   WBC 6.4 3.8 - 10.8 Thousand/uL   RBC 5.09 4.20 - 5.80 Million/uL   Hemoglobin 15.5 13.2 - 17.1 g/dL   HCT 81.1 91.4 - 78.2 %   MCV 88.2 80.0 - 100.0 fL   MCH 30.5 27.0 - 33.0 pg   MCHC 34.5 32.0 - 36.0 g/dL   RDW 95.6 21.3 - 08.6 %   Platelets 181 140 - 400 Thousand/uL  MPV 11.6 7.5 - 12.5 fL   Neutro Abs 3,616 1,500 - 7,800 cells/uL   Lymphs Abs 1,843 850 - 3,900 cells/uL   Absolute Monocytes 723 200 - 950 cells/uL   Eosinophils Absolute 179 15 - 500 cells/uL   Basophils Absolute 38 0 - 200 cells/uL   Neutrophils Relative % 56.5 %   Total Lymphocyte 28.8 %   Monocytes Relative 11.3 %   Eosinophils Relative 2.8 %   Basophils Relative 0.6 %  COMPLETE METABOLIC PANEL WITH GFR  Result Value Ref Range   Glucose, Bld 105 (H) 65 - 99 mg/dL   BUN 15 7 - 25 mg/dL   Creat 4.09 (H) 8.11 - 1.30 mg/dL   eGFR 61 > OR = 60 BJ/YNW/2.95A2   BUN/Creatinine Ratio 11 6 - 22 (calc)   Sodium 143 135 - 146 mmol/L   Potassium 3.6 3.5 - 5.3 mmol/L   Chloride 106 98 - 110 mmol/L   CO2 33 (H) 20 - 32  mmol/L   Calcium 8.9 8.6 - 10.3 mg/dL   Total Protein 5.7 (L) 6.1 - 8.1 g/dL   Albumin 3.8 3.6 - 5.1 g/dL   Globulin 1.9 1.9 - 3.7 g/dL (calc)   AG Ratio 2.0 1.0 - 2.5 (calc)   Total Bilirubin 0.5 0.2 - 1.2 mg/dL   Alkaline phosphatase (APISO) 50 35 - 144 U/L   AST 24 10 - 35 U/L   ALT 27 9 - 46 U/L  Magnesium  Result Value Ref Range   Magnesium 2.2 1.5 - 2.5 mg/dL      Assessment & Plan:   Problem List Items Addressed This Visit   None    Follow up plan: No follow-ups on file.

## 2022-10-26 ENCOUNTER — Other Ambulatory Visit: Payer: Self-pay

## 2022-10-26 ENCOUNTER — Encounter: Payer: Self-pay | Admitting: Nurse Practitioner

## 2022-10-26 ENCOUNTER — Ambulatory Visit (INDEPENDENT_AMBULATORY_CARE_PROVIDER_SITE_OTHER): Payer: 59 | Admitting: Nurse Practitioner

## 2022-10-26 VITALS — BP 134/82 | Temp 98.0°F | Resp 16 | Ht 65.0 in | Wt 182.9 lb

## 2022-10-26 DIAGNOSIS — E782 Mixed hyperlipidemia: Secondary | ICD-10-CM

## 2022-10-26 DIAGNOSIS — Z131 Encounter for screening for diabetes mellitus: Secondary | ICD-10-CM

## 2022-10-26 DIAGNOSIS — Z111 Encounter for screening for respiratory tuberculosis: Secondary | ICD-10-CM | POA: Diagnosis not present

## 2022-10-26 DIAGNOSIS — I10 Essential (primary) hypertension: Secondary | ICD-10-CM

## 2022-10-26 DIAGNOSIS — J4520 Mild intermittent asthma, uncomplicated: Secondary | ICD-10-CM

## 2022-10-26 DIAGNOSIS — R944 Abnormal results of kidney function studies: Secondary | ICD-10-CM

## 2022-10-26 MED ORDER — ALBUTEROL SULFATE HFA 108 (90 BASE) MCG/ACT IN AERS
INHALATION_SPRAY | RESPIRATORY_TRACT | 3 refills | Status: DC
Start: 2022-10-26 — End: 2023-05-17

## 2022-10-26 MED ORDER — AMLODIPINE BESYLATE 5 MG PO TABS: 5.0000 mg | ORAL_TABLET | Freq: Every day | ORAL | 3 refills | Status: DC

## 2022-10-26 NOTE — Assessment & Plan Note (Signed)
Was recently started on atorvastatin 10 mg daily.  Getting labs today to check his cholesterol.

## 2022-10-26 NOTE — Assessment & Plan Note (Signed)
Getting labs today to check his GFR.

## 2022-10-26 NOTE — Assessment & Plan Note (Signed)
Patient uses albuterol as needed.  He has not needed it in a while.  But does states he does need a refill.

## 2022-10-26 NOTE — Assessment & Plan Note (Signed)
Continue taking amlodipine 5 mg daily.  Make sure you are taking your blood pressure medicine every day.

## 2022-10-28 LAB — LIPID PANEL
Cholesterol: 162 mg/dL (ref <200)
HDL: 34 mg/dL — ABNORMAL LOW (ref >=40)
LDL Cholesterol (Calc): 100 mg/dL (calc) — ABNORMAL HIGH
Non-HDL Cholesterol (Calc): 128 mg/dL (calc) (ref <130)
Total CHOL/HDL Ratio: 4.8 (calc) (ref <5.0)
Triglycerides: 186 mg/dL — ABNORMAL HIGH (ref <150)

## 2022-10-28 LAB — CBC WITH DIFFERENTIAL/PLATELET
Absolute Monocytes: 739 cells/uL (ref 200–950)
Basophils Absolute: 53 cells/uL (ref 0–200)
Basophils Relative: 0.6 %
Eosinophils Absolute: 160 cells/uL (ref 15–500)
Eosinophils Relative: 1.8 %
HCT: 45.3 % (ref 38.5–50.0)
Hemoglobin: 15.6 g/dL (ref 13.2–17.1)
Lymphs Abs: 2207 cells/uL (ref 850–3900)
MCH: 29.4 pg (ref 27.0–33.0)
MCHC: 34.4 g/dL (ref 32.0–36.0)
MCV: 85.5 fL (ref 80.0–100.0)
MPV: 11.5 fL (ref 7.5–12.5)
Monocytes Relative: 8.3 %
Neutro Abs: 5741 cells/uL (ref 1500–7800)
Neutrophils Relative %: 64.5 %
Platelets: 212 10*3/uL (ref 140–400)
RBC: 5.3 10*6/uL (ref 4.20–5.80)
RDW: 13 % (ref 11.0–15.0)
Total Lymphocyte: 24.8 %
WBC: 8.9 10*3/uL (ref 3.8–10.8)

## 2022-10-28 LAB — QUANTIFERON-TB GOLD PLUS
Mitogen-NIL: 8.23 IU/mL
NIL: 0.01 IU/mL
QuantiFERON-TB Gold Plus: NEGATIVE
TB1-NIL: 0 IU/mL
TB2-NIL: 0 IU/mL

## 2022-10-28 LAB — COMPLETE METABOLIC PANEL WITH GFR
AG Ratio: 1.9 (calc) (ref 1.0–2.5)
ALT: 22 U/L (ref 9–46)
AST: 19 U/L (ref 10–35)
Albumin: 3.5 g/dL — ABNORMAL LOW (ref 3.6–5.1)
Alkaline phosphatase (APISO): 64 U/L (ref 35–144)
BUN: 13 mg/dL (ref 7–25)
CO2: 29 mmol/L (ref 20–32)
Calcium: 8.5 mg/dL — ABNORMAL LOW (ref 8.6–10.3)
Chloride: 104 mmol/L (ref 98–110)
Creat: 1.21 mg/dL (ref 0.70–1.30)
Globulin: 1.8 g/dL (calc) — ABNORMAL LOW (ref 1.9–3.7)
Glucose, Bld: 89 mg/dL (ref 65–99)
Potassium: 4.2 mmol/L (ref 3.5–5.3)
Sodium: 141 mmol/L (ref 135–146)
Total Bilirubin: 0.3 mg/dL (ref 0.2–1.2)
Total Protein: 5.3 g/dL — ABNORMAL LOW (ref 6.1–8.1)
eGFR: 71 mL/min/{1.73_m2} (ref >=60)

## 2022-10-28 LAB — HEMOGLOBIN A1C
Hgb A1c MFr Bld: 5.8 % of total Hgb — ABNORMAL HIGH (ref <5.7)
Mean Plasma Glucose: 120 mg/dL
eAG (mmol/L): 6.6 mmol/L

## 2023-04-30 ENCOUNTER — Ambulatory Visit: Payer: 59 | Admitting: Nurse Practitioner

## 2023-04-30 NOTE — Progress Notes (Deleted)
There were no vitals taken for this visit.   Subjective:    [name], male    DOB: 1965-10-28, 57 y.o.   MRN: [id number]  HPI: Dave Vasquez is a 57 y.o. male  No chief complaint on file.  Hypertension:  -Medications: amlodipine 5 mg daiy -Patient is compliant with above medications and reports no side effects. -Checking BP at home (average): *** -Denies any SOB, CP, vision changes, LE edema or symptoms of hypotension -Diet: recommend DASH diet  -Exercise: recommend 150 min of physical activity weekly      Asthma: Patient uses an albuterol inhaler when he has an asthma flare. Condition stable, no changes.   HLD:  -Medications: atorvastatin 10 mg daily -Patient is compliant with above medications and reports no side effects.  -Last lipid panel:  Lipid Panel     Component Value Date/Time   CHOL 162 10/26/2022 1116   TRIG 186 (H) 10/26/2022 1116   HDL 34 (L) 10/26/2022 1116   CHOLHDL 4.8 10/26/2022 1116   LDLCALC 100 (H) 10/26/2022 1116    The 10-year ASCVD risk score (Arnett DK, et al., 2019) is: 12.5%   Values used to calculate the score:     Age: 16 years     Sex: Male     Is Non-Hispanic African American: Yes     Diabetic: No     Tobacco smoker: No     Systolic Blood Pressure: 134 mmHg     Is BP treated: Yes     HDL Cholesterol: 34 mg/dL     Total Cholesterol: 162 mg/dL   Decreased gfr: Last GFR was 61 on 01/16/2022 prior to that it was 59 on 10/14/2021.  Patient due for labs.  Relevant past medical, surgical, family and social history reviewed and updated as indicated. Interim medical history since our last visit reviewed. Allergies and medications reviewed and updated.  Review of Systems  Constitutional: Negative for fever or weight change.  Respiratory: Negative for cough and shortness of breath.   Cardiovascular: Negative for chest pain or palpitations.  Gastrointestinal: Negative for abdominal pain, no bowel changes.  Musculoskeletal:  Negative for gait problem or joint swelling.  Skin: Negative for rash.  Neurological: Negative for dizziness or headache.  No other specific complaints in a complete review of systems (except as listed in HPI above).      Objective:    There were no vitals taken for this visit.  Wt Readings from Last 3 Encounters:  10/26/22 182 lb 14.4 oz (83 kg)  05/04/22 189 lb (85.7 kg)  04/27/22 189 lb (85.7 kg)    Physical Exam  Constitutional: Patient appears well-developed and well-nourished. Obese  No distress.  HEENT: head atraumatic, normocephalic, pupils equal and reactive to light,  neck supple Cardiovascular: Normal rate, regular rhythm and normal heart sounds.  No murmur heard. No BLE edema. Pulmonary/Chest: Effort normal and breath sounds normal. No respiratory distress. Abdominal: Soft.  There is no tenderness. Psychiatric: Patient has a normal mood and affect. behavior is normal. Judgment and thought content normal.  Results for orders placed or performed in visit on 10/26/22  QuantiFERON-TB Gold Plus  Result Value Ref Range   QuantiFERON-TB Gold Plus NEGATIVE NEGATIVE   NIL 0.01 IU/mL   Mitogen-NIL 8.23 IU/mL   TB1-NIL 0.00 IU/mL   TB2-NIL 0.00 IU/mL  CBC with Differential/Platelet  Result Value Ref Range   WBC 8.9 3.8 - 10.8 Thousand/uL   RBC 5.30 4.20 - 5.80  Million/uL   Hemoglobin 15.6 13.2 - 17.1 g/dL   HCT 46.9 62.9 - 52.8 %   MCV 85.5 80.0 - 100.0 fL   MCH 29.4 27.0 - 33.0 pg   MCHC 34.4 32.0 - 36.0 g/dL   RDW 41.3 24.4 - 01.0 %   Platelets 212 140 - 400 Thousand/uL   MPV 11.5 7.5 - 12.5 fL   Neutro Abs 5,741 1,500 - 7,800 cells/uL   Lymphs Abs 2,207 850 - 3,900 cells/uL   Absolute Monocytes 739 200 - 950 cells/uL   Eosinophils Absolute 160 15 - 500 cells/uL   Basophils Absolute 53 0 - 200 cells/uL   Neutrophils Relative % 64.5 %   Total Lymphocyte 24.8 %   Monocytes Relative 8.3 %   Eosinophils Relative 1.8 %   Basophils Relative 0.6 %  COMPLETE METABOLIC  PANEL WITH GFR  Result Value Ref Range   Glucose, Bld 89 65 - 99 mg/dL   BUN 13 7 - 25 mg/dL   Creat 2.72 5.36 - 6.44 mg/dL   eGFR 71 > OR = 60 IH/KVQ/2.59D6   BUN/Creatinine Ratio SEE NOTE: 6 - 22 (calc)   Sodium 141 135 - 146 mmol/L   Potassium 4.2 3.5 - 5.3 mmol/L   Chloride 104 98 - 110 mmol/L   CO2 29 20 - 32 mmol/L   Calcium 8.5 (L) 8.6 - 10.3 mg/dL   Total Protein 5.3 (L) 6.1 - 8.1 g/dL   Albumin 3.5 (L) 3.6 - 5.1 g/dL   Globulin 1.8 (L) 1.9 - 3.7 g/dL (calc)   AG Ratio 1.9 1.0 - 2.5 (calc)   Total Bilirubin 0.3 0.2 - 1.2 mg/dL   Alkaline phosphatase (APISO) 64 35 - 144 U/L   AST 19 10 - 35 U/L   ALT 22 9 - 46 U/L  Lipid panel  Result Value Ref Range   Cholesterol 162 <200 mg/dL   HDL 34 (L) > OR = 40 mg/dL   Triglycerides 387 (H) <150 mg/dL   LDL Cholesterol (Calc) 100 (H) mg/dL (calc)   Total CHOL/HDL Ratio 4.8 <5.0 (calc)   Non-HDL Cholesterol (Calc) 128 <130 mg/dL (calc)  Hemoglobin F6E  Result Value Ref Range   Hgb A1c MFr Bld 5.8 (H) <5.7 % of total Hgb   Mean Plasma Glucose 120 mg/dL   eAG (mmol/L) 6.6 mmol/L      Assessment & Plan:   Problem List Items Addressed This Visit   None     Follow up plan: No follow-ups on file.

## 2023-05-12 ENCOUNTER — Other Ambulatory Visit: Payer: Self-pay

## 2023-05-12 ENCOUNTER — Emergency Department
Admission: EM | Admit: 2023-05-12 | Discharge: 2023-05-12 | Disposition: A | Discharge status: Home / Self Care | Payer: 59 | Attending: Emergency Medicine | Admitting: Emergency Medicine

## 2023-05-12 DIAGNOSIS — K625 Hemorrhage of anus and rectum: Secondary | ICD-10-CM | POA: Diagnosis not present

## 2023-05-12 DIAGNOSIS — K649 Unspecified hemorrhoids: Secondary | ICD-10-CM | POA: Insufficient documentation

## 2023-05-12 LAB — COMPREHENSIVE METABOLIC PANEL
ALT: 19 U/L (ref 0–44)
AST: 23 U/L (ref 15–41)
Albumin: 3.3 g/dL — ABNORMAL LOW (ref 3.5–5.0)
Alkaline Phosphatase: 49 U/L (ref 38–126)
Anion gap: 5 (ref 5–15)
BUN: 16 mg/dL (ref 6–20)
CO2: 30 mmol/L (ref 22–32)
Calcium: 8.2 mg/dL — ABNORMAL LOW (ref 8.9–10.3)
Chloride: 101 mmol/L (ref 98–111)
Creatinine, Ser: 1.3 mg/dL — ABNORMAL HIGH (ref 0.61–1.24)
GFR, Estimated: >60 mL/min (ref >60)
Glucose, Bld: 113 mg/dL — ABNORMAL HIGH (ref 70–99)
Potassium: 3.5 mmol/L (ref 3.5–5.1)
Sodium: 136 mmol/L (ref 135–145)
Total Bilirubin: 0.5 mg/dL (ref <1.2)
Total Protein: 5.6 g/dL — ABNORMAL LOW (ref 6.5–8.1)

## 2023-05-12 LAB — CBC
HCT: 45.9 % (ref 39.0–52.0)
Hemoglobin: 14.9 g/dL (ref 13.0–17.0)
MCH: 29.7 pg (ref 26.0–34.0)
MCHC: 32.5 g/dL (ref 30.0–36.0)
MCV: 91.4 fL (ref 80.0–100.0)
Platelets: 205 10*3/uL (ref 150–400)
RBC: 5.02 MIL/uL (ref 4.22–5.81)
RDW: 13.2 % (ref 11.5–15.5)
WBC: 7.2 10*3/uL (ref 4.0–10.5)
nRBC: 0 % (ref 0.0–0.2)

## 2023-05-12 NOTE — ED Triage Notes (Signed)
Pt to ED for rectal bleeding only seen with wiping since 0400 today (noticed 4x). Denies blood thinners. Denies hemorrhoids.  Denies weakness, dizziness. Appears well, skin dry.

## 2023-05-12 NOTE — ED Provider Notes (Signed)
[facility] Provider Note    Event Date/Time   First MD Initiated Contact with Patient 05/12/23 1223     (approximate)   History   Rectal Bleeding   HPI Dave Vasquez is a 57 y.o. male with history of HTN and HLD presenting today for rectal bleeding.  Patient states that he has had 2 episodes in the last 24 hours where he has wiped following a bowel movement and noticed a small amount of bright red blood.  He denies abdominal pain, pain with bowel movement, or pain with wiping.  He has not noted any blood in the toilet.  No blood on the stools and no black stools.  Otherwise denies nausea or vomiting.  No prior history of GI bleeds.  Not on blood thinners.  Did state that he has had a couple episodes of diarrhea over the past 24 hours following eating [place] food.     Physical Exam   Triage Vital Signs: ED Triage Vitals  Encounter Vitals Group     BP 05/12/23 1144 (!) 156/80     Systolic BP Percentile --      Diastolic BP Percentile --      Pulse Rate 05/12/23 1144 (!) 58     Resp 05/12/23 1144 16     Temp 05/12/23 1144 (!) 97.5 F (36.4 C)     Temp Source 05/12/23 1144 Oral     SpO2 05/12/23 1144 99 %     Weight 05/12/23 1145 176 lb (79.8 kg)     Height 05/12/23 1145 5\' 5"  (1.651 m)     Head Circumference --      Peak Flow --      Pain Score 05/12/23 1145 0     Pain Loc --      Pain Education --      Exclude from Growth Chart --     Most recent vital signs: Vitals:   05/12/23 1144  BP: (!) 156/80  Pulse: (!) 58  Resp: 16  Temp: (!) 97.5 F (36.4 C)  SpO2: 99%   I have reviewed the vital signs. General:  Awake, alert, no acute distress. Head:  Normocephalic, Atraumatic. EENT:  PERRL, EOMI, Oral mucosa pink and moist, Neck is supple. Cardiovascular: Regular rate, 2+ distal pulses. Respiratory:  Normal respiratory effort, symmetrical expansion, no distress.   Extremities:  Moving all four extremities through full ROM without pain.    Abdomen: Soft, nontender, nondistended Rectal: No obvious anal fissures or external hemorrhoids noted.  No active bleeding present. Neuro:  Alert and oriented.  Interacting appropriately.   Skin:  Warm, dry, no rash.   Psych: Appropriate affect.    ED Results / Procedures / Treatments   Labs (all labs ordered are listed, but only abnormal results are displayed) Labs Reviewed  COMPREHENSIVE METABOLIC PANEL - Abnormal; Notable for the following components:      Result Value   Glucose, Bld 113 (*)    Creatinine, Ser 1.30 (*)    Calcium 8.2 (*)    Total Protein 5.6 (*)    Albumin 3.3 (*)    All other components within normal limits  CBC  POC OCCULT BLOOD, ED     EKG    RADIOLOGY    PROCEDURES:  Critical Care performed: No  Procedures   MEDICATIONS ORDERED IN ED: Medications - No data to display   IMPRESSION / MDM / ASSESSMENT AND PLAN / ED COURSE  I reviewed the triage vital  signs and the nursing notes.                              Differential diagnosis includes, but is not limited to, external hemorrhoid, internal hemorrhoid, anal fissure, less likely diverticular or upper GI bleed  Patient's presentation is most consistent with acute complicated illness / injury requiring diagnostic workup.  Patient is a 57 year old male presenting today for 2 episodes of bright red blood on his tissue paper after wiping.  Vital signs are otherwise stable at this time.  Hemoglobin is stable with no acute blood loss noted.  No concerning symptoms such as melanotic stools, hematemesis, gross blood per rectum, or severe abdominal pain.  Given the stable hemoglobin, stable vital signs, no bleeding at this time, do not think he warrants further GI evaluation in the ED.  Recommend he call his primary care provider to set up an outpatient colonoscopy.  Patient was given strict return precautions for any worsening bleeding symptoms signs which she was agreeable with.  Clinical Course  as of 05/12/23 1306  Sat May 12, 2023  1228 Comprehensive metabolic panel(!) Creatinine comparable to prior baseline [DW]    Clinical Course User Index [DW] Janith Lima, MD     FINAL CLINICAL IMPRESSION(S) / ED DIAGNOSES   Final diagnoses:  Hemorrhoids, unspecified hemorrhoid type     Rx / DC Orders   ED Discharge Orders     None        Note:  This document was prepared using Dragon voice recognition software and may include unintentional dictation errors.   Janith Lima, MD 05/12/23 1309

## 2023-05-15 ENCOUNTER — Other Ambulatory Visit: Payer: Self-pay | Admitting: Nurse Practitioner

## 2023-05-15 ENCOUNTER — Telehealth: Payer: Self-pay | Admitting: Nurse Practitioner

## 2023-05-15 DIAGNOSIS — K625 Hemorrhage of anus and rectum: Secondary | ICD-10-CM

## 2023-05-15 DIAGNOSIS — Z1211 Encounter for screening for malignant neoplasm of colon: Secondary | ICD-10-CM

## 2023-05-15 NOTE — Telephone Encounter (Signed)
Seen in ER on Saturday and they informed him to get a Colonoscopy. Patient stated he was bleeding from rectum  Referral was sent in last year he did not do. Please put in a new referral

## 2023-05-15 NOTE — Telephone Encounter (Signed)
Copied from CRM [id number]. Topic: Referral - Request for Referral >> May 15, 2023 10:44 AM Everette C wrote: Did the patient discuss referral with their provider in the last year? Yes (If No - schedule appointment) (If Yes - send message)  Appointment offered? No  Type of order/referral and detailed reason for visit: Gastroenterology / Endoscopy / Colonoscopy   Preference of office, provider, location: Patient has no preference   If referral order, have you been seen by this specialty before? No (If Yes, this issue or another issue? When? Where?  Can we respond through MyChart? No

## 2023-05-16 NOTE — Progress Notes (Unsigned)
There were no vitals taken for this visit.   Subjective:    Patient ID: SWEN KERNS, male    DOB: 10/21/1965, 57 y.o.   MRN: [id number]  HPI: Dave Vasquez is a 57 y.o. male  No chief complaint on file.  Hypertension:  -Medications: amlodipine 5 mg daiy -Patient is compliant with above medications and reports no side effects. -Checking BP at home (average): *** -Denies any SOB, CP, vision changes, LE edema or symptoms of hypotension -Diet: recommend DASH diet  -Exercise: recommend 150 min of physical activity weekly      Asthma: Patient uses an albuterol inhaler when he has an asthma flare. Condition stable, no changes.   HLD:  -Medications: atorvastatin 10 mg daily -Patient is compliant with above medications and reports no side effects.  -Last lipid panel:  Lipid Panel     Component Value Date/Time   CHOL 162 10/26/2022 1116   TRIG 186 (H) 10/26/2022 1116   HDL 34 (L) 10/26/2022 1116   CHOLHDL 4.8 10/26/2022 1116   LDLCALC 100 (H) 10/26/2022 1116    The 10-year ASCVD risk score (Arnett DK, et al., 2019) is: 16.4%   Values used to calculate the score:     Age: 58 years     Sex: Male     Is Non-Hispanic African American: Yes     Diabetic: No     Tobacco smoker: No     Systolic Blood Pressure: 156 mmHg     Is BP treated: Yes     HDL Cholesterol: 34 mg/dL     Total Cholesterol: 162 mg/dL   Decreased gfr: Last GFR was 61 on 01/16/2022 prior to that it was 59 on 10/14/2021.  Patient due for labs.  Relevant past medical, surgical, family and social history reviewed and updated as indicated. Interim medical history since our last visit reviewed. Allergies and medications reviewed and updated.  Review of Systems  Constitutional: Negative for fever or weight change.  Respiratory: Negative for cough and shortness of breath.   Cardiovascular: Negative for chest pain or palpitations.  Gastrointestinal: Negative for abdominal pain, no bowel changes.  Musculoskeletal:  Negative for gait problem or joint swelling.  Skin: Negative for rash.  Neurological: Negative for dizziness or headache.  No other specific complaints in a complete review of systems (except as listed in HPI above).      Objective:    There were no vitals taken for this visit.  Wt Readings from Last 3 Encounters:  05/12/23 176 lb (79.8 kg)  10/26/22 182 lb 14.4 oz (83 kg)  05/04/22 189 lb (85.7 kg)    Physical Exam  Constitutional: Patient appears well-developed and well-nourished. Obese  No distress.  HEENT: head atraumatic, normocephalic, pupils equal and reactive to light,  neck supple Cardiovascular: Normal rate, regular rhythm and normal heart sounds.  No murmur heard. No BLE edema. Pulmonary/Chest: Effort normal and breath sounds normal. No respiratory distress. Abdominal: Soft.  There is no tenderness. Psychiatric: Patient has a normal mood and affect. behavior is normal. Judgment and thought content normal.  Results for orders placed or performed during the hospital encounter of 05/12/23  Comprehensive metabolic panel  Result Value Ref Range   Sodium 136 135 - 145 mmol/L   Potassium 3.5 3.5 - 5.1 mmol/L   Chloride 101 98 - 111 mmol/L   CO2 30 22 - 32 mmol/L   Glucose, Bld 113 (H) 70 - 99 mg/dL   BUN 16 6 - 20  mg/dL   Creatinine, Ser 3.71 (H) 0.61 - 1.24 mg/dL   Calcium 8.2 (L) 8.9 - 10.3 mg/dL   Total Protein 5.6 (L) 6.5 - 8.1 g/dL   Albumin 3.3 (L) 3.5 - 5.0 g/dL   AST 23 15 - 41 U/L   ALT 19 0 - 44 U/L   Alkaline Phosphatase 49 38 - 126 U/L   Total Bilirubin 0.5 <1.2 mg/dL   GFR, Estimated >69 >67 mL/min   Anion gap 5 5 - 15  CBC  Result Value Ref Range   WBC 7.2 4.0 - 10.5 K/uL   RBC 5.02 4.22 - 5.81 MIL/uL   Hemoglobin 14.9 13.0 - 17.0 g/dL   HCT 89.3 81.0 - 17.5 %   MCV 91.4 80.0 - 100.0 fL   MCH 29.7 26.0 - 34.0 pg   MCHC 32.5 30.0 - 36.0 g/dL   RDW 10.2 58.5 - 27.7 %   Platelets 205 150 - 400 K/uL   nRBC 0.0 0.0 - 0.2 %      Assessment & Plan:    Problem List Items Addressed This Visit   None     Follow up plan: No follow-ups on file.

## 2023-05-17 ENCOUNTER — Telehealth: Payer: Self-pay

## 2023-05-17 ENCOUNTER — Other Ambulatory Visit: Payer: Self-pay

## 2023-05-17 ENCOUNTER — Ambulatory Visit (INDEPENDENT_AMBULATORY_CARE_PROVIDER_SITE_OTHER): Payer: 59 | Admitting: Nurse Practitioner

## 2023-05-17 ENCOUNTER — Encounter: Payer: Self-pay | Admitting: Nurse Practitioner

## 2023-05-17 VITALS — BP 126/84 | HR 92 | Temp 97.9°F | Resp 18 | Ht 65.0 in | Wt 181.6 lb

## 2023-05-17 DIAGNOSIS — R944 Abnormal results of kidney function studies: Secondary | ICD-10-CM | POA: Diagnosis not present

## 2023-05-17 DIAGNOSIS — K625 Hemorrhage of anus and rectum: Secondary | ICD-10-CM | POA: Diagnosis not present

## 2023-05-17 DIAGNOSIS — I10 Essential (primary) hypertension: Secondary | ICD-10-CM | POA: Diagnosis not present

## 2023-05-17 DIAGNOSIS — Z23 Encounter for immunization: Secondary | ICD-10-CM | POA: Diagnosis not present

## 2023-05-17 DIAGNOSIS — E782 Mixed hyperlipidemia: Secondary | ICD-10-CM

## 2023-05-17 DIAGNOSIS — Z1211 Encounter for screening for malignant neoplasm of colon: Secondary | ICD-10-CM

## 2023-05-17 DIAGNOSIS — J4520 Mild intermittent asthma, uncomplicated: Secondary | ICD-10-CM

## 2023-05-17 DIAGNOSIS — E781 Pure hyperglyceridemia: Secondary | ICD-10-CM | POA: Diagnosis not present

## 2023-05-17 MED ORDER — AMLODIPINE BESYLATE 5 MG PO TABS: 5.0000 mg | ORAL_TABLET | Freq: Every day | ORAL | 3 refills | Status: AC

## 2023-05-17 MED ORDER — ATORVASTATIN CALCIUM 10 MG PO TABS: 10.0000 mg | ORAL_TABLET | Freq: Every day | ORAL | 3 refills | Status: AC

## 2023-05-17 MED ORDER — ALBUTEROL SULFATE HFA 108 (90 BASE) MCG/ACT IN AERS
INHALATION_SPRAY | RESPIRATORY_TRACT | 3 refills | Status: AC
Start: 2023-05-17 — End: ?

## 2023-05-17 NOTE — Assessment & Plan Note (Signed)
Continue atorvastatin 10 mg daily. 

## 2023-05-17 NOTE — Assessment & Plan Note (Signed)
-  Continue amlodipine 5 mg daily

## 2023-05-17 NOTE — Telephone Encounter (Signed)
Gastroenterology Pre-Procedure Review  Request Date: TBD Requesting Physician: Dr. Jodelle Gross  PATIENT REVIEW QUESTIONS: The patient responded to the following health history questions as indicated:    1. Are you having any GI issues? no 2. Do you have a personal history of Polyps? no 3. Do you have a family history of Colon Cancer or Polyps? no 4. Diabetes Mellitus? no 5. Joint replacements in the past 12 months?no 6. Major health problems in the past 3 months?no 7. Any artificial heart valves, MVP, or defibrillator?no    MEDICATIONS & ALLERGIES:    Patient reports the following regarding taking any anticoagulation/antiplatelet therapy:   Plavix, Coumadin, Eliquis, Xarelto, Lovenox, Pradaxa, Brilinta, or Effient? no Aspirin? no  Patient confirms/reports the following medications:  Current Outpatient Medications  Medication Sig Dispense Refill   albuterol (VENTOLIN HFA) 108 (90 Base) MCG/ACT inhaler 2 puffs q.i.d. p.r.n. short of breath, wheezing, or cough 18 g 3   amLODipine (NORVASC) 5 MG tablet Take 1 tablet (5 mg total) by mouth daily. 90 tablet 3   atorvastatin (LIPITOR) 10 MG tablet Take 1 tablet (10 mg total) by mouth daily. 90 tablet 3   No current facility-administered medications for this visit.    Patient confirms/reports the following allergies:  Allergies  Allergen Reactions   No Known Allergies     No orders of the defined types were placed in this encounter.   AUTHORIZATION INFORMATION Primary Insurance: 1D#: Group #:  Secondary Insurance: 1D#: Group #:  SCHEDULE INFORMATION: Date: TBD Time: Location: [facility]

## 2023-05-17 NOTE — Assessment & Plan Note (Signed)
improved

## 2023-05-22 ENCOUNTER — Other Ambulatory Visit: Payer: Self-pay

## 2023-05-22 DIAGNOSIS — Z1211 Encounter for screening for malignant neoplasm of colon: Secondary | ICD-10-CM

## 2023-05-22 MED ORDER — NA SULFATE-K SULFATE-MG SULF 17.5-3.13-1.6 GM/177ML PO SOLN: 1.0000 | Freq: Once | ORAL | 0 refills | Status: AC

## 2023-05-22 NOTE — Addendum Note (Signed)
Addended by: Avie Arenas on: 05/22/2023 10:38 AM   Modules accepted: Orders

## 2023-05-22 NOTE — Telephone Encounter (Signed)
Patient is calling to set up colonoscopy. He would like to do this the first week of December if possible.

## 2023-05-22 NOTE — Telephone Encounter (Signed)
Call returned.  Colonoscopy has been scheduled with Dr. Allegra Lai for 06/06/23 at [facility].  Thanks, [place], [place]

## 2023-05-28 DIAGNOSIS — I10 Essential (primary) hypertension: Secondary | ICD-10-CM | POA: Diagnosis not present

## 2023-05-28 DIAGNOSIS — E785 Hyperlipidemia, unspecified: Secondary | ICD-10-CM | POA: Diagnosis not present

## 2023-05-28 DIAGNOSIS — Z8249 Family history of ischemic heart disease and other diseases of the circulatory system: Secondary | ICD-10-CM | POA: Diagnosis not present

## 2023-05-28 DIAGNOSIS — Z833 Family history of diabetes mellitus: Secondary | ICD-10-CM | POA: Diagnosis not present

## 2023-05-28 DIAGNOSIS — J45909 Unspecified asthma, uncomplicated: Secondary | ICD-10-CM | POA: Diagnosis not present

## 2023-05-28 DIAGNOSIS — Z809 Family history of malignant neoplasm, unspecified: Secondary | ICD-10-CM | POA: Diagnosis not present

## 2023-05-29 ENCOUNTER — Telehealth: Payer: Self-pay | Admitting: *Deleted

## 2023-05-29 NOTE — Telephone Encounter (Signed)
Transition Care Management Unsuccessful Follow-up Telephone Call  Date of discharge and from where:  [facility]  05/12/2023  Attempts:  1st Attempt  Reason for unsuccessful TCM follow-up call:  No answer/busy

## 2023-05-30 ENCOUNTER — Telehealth: Payer: Self-pay | Admitting: *Deleted

## 2023-05-30 NOTE — Telephone Encounter (Signed)
Transition Care Management Unsuccessful Follow-up Telephone Call  Date of discharge and from where:  [facility]  05/12/2023  Attempts:  2nd attempt  Reason for unsuccessful TCM follow-up call:  Missing or invalid number

## 2023-06-05 ENCOUNTER — Encounter: Payer: Self-pay | Admitting: Gastroenterology

## 2023-06-06 ENCOUNTER — Ambulatory Visit
Admission: RE | Admit: 2023-06-06 | Discharge: 2023-06-06 | Disposition: A | Discharge status: Home / Self Care | Payer: 59 | Attending: Gastroenterology | Admitting: Gastroenterology

## 2023-06-06 ENCOUNTER — Encounter
Admission: RE | Disposition: A | Discharge status: Home / Self Care | Payer: Self-pay | Source: Home / Self Care | Attending: Gastroenterology

## 2023-06-06 ENCOUNTER — Ambulatory Visit: Payer: 59 | Admitting: Anesthesiology

## 2023-06-06 DIAGNOSIS — I10 Essential (primary) hypertension: Secondary | ICD-10-CM | POA: Insufficient documentation

## 2023-06-06 DIAGNOSIS — J45909 Unspecified asthma, uncomplicated: Secondary | ICD-10-CM | POA: Insufficient documentation

## 2023-06-06 DIAGNOSIS — Z833 Family history of diabetes mellitus: Secondary | ICD-10-CM | POA: Insufficient documentation

## 2023-06-06 DIAGNOSIS — Z79899 Other long term (current) drug therapy: Secondary | ICD-10-CM | POA: Insufficient documentation

## 2023-06-06 DIAGNOSIS — Z1211 Encounter for screening for malignant neoplasm of colon: Secondary | ICD-10-CM | POA: Insufficient documentation

## 2023-06-06 HISTORY — PX: COLONOSCOPY WITH PROPOFOL: SHX5780

## 2023-06-06 SURGERY — COLONOSCOPY WITH PROPOFOL
Anesthesia: General

## 2023-06-06 MED ORDER — SODIUM CHLORIDE 0.9 % IV SOLN: INTRAVENOUS | Status: DC

## 2023-06-06 MED ORDER — PROPOFOL 10 MG/ML IV BOLUS
INTRAVENOUS | Status: DC | PRN
  Administered 2023-06-06: 100 mg via INTRAVENOUS

## 2023-06-06 MED ORDER — PROPOFOL 500 MG/50ML IV EMUL
INTRAVENOUS | Status: DC | PRN
  Administered 2023-06-06: 125 ug/kg/min via INTRAVENOUS

## 2023-06-06 NOTE — H&P (Signed)
Dave Repress, MD [address]  [address]  [place], [place] [address]  Main: [phone]  Fax: [phone] Pager: [phone]  Primary Care Physician:  Dave Salines, FNP Primary Gastroenterologist:  Dr. Arlyss Vasquez  Pre-Procedure History & Physical: HPI:  Dave Vasquez is a 57 y.o. male is here for an colonoscopy.   Past Medical History:  Diagnosis Date   Asthma    Hypertension     History reviewed. No pertinent surgical history.  Prior to Admission medications   Medication Sig Start Date End Date Taking? Authorizing Provider  albuterol (VENTOLIN HFA) 108 (90 Base) MCG/ACT inhaler 2 puffs q.i.d. p.r.n. short of breath, wheezing, or cough 05/17/23   Dave Salines, FNP  amLODipine (NORVASC) 5 MG tablet Take 1 tablet (5 mg total) by mouth daily. 05/17/23   Dave Salines, FNP  atorvastatin (LIPITOR) 10 MG tablet Take 1 tablet (10 mg total) by mouth daily. 05/17/23   Dave Salines, FNP    Allergies as of 05/22/2023 - Review Complete 05/17/2023  Allergen Reaction Noted   No known allergies  10/14/2021    Family History  Problem Relation Age of Onset   Diabetes Mother     Social History   Socioeconomic History   Marital status: Single    Spouse name: Not on file   Number of children: 8   Years of education: Not on file   Highest education level: Not on file  Occupational History   Not on file  Tobacco Use   Smoking status: Never   Smokeless tobacco: Never  Vaping Use   Vaping status: Never Used  Substance and Sexual Activity   Alcohol use: No   Drug use: No   Sexual activity: Not Currently  Other Topics Concern   Not on file  Social History Narrative   Not on file   Social Determinants of Health   Financial Resource Strain: Low Risk  (10/14/2021)   Overall Financial Resource Strain (CARDIA)    Difficulty of Paying Living Expenses: Not hard at all  Food Insecurity: No Food Insecurity (10/14/2021)   Hunger Vital Sign    Worried About  Running Out of Food in the Last Year: Never true    Ran Out of Food in the Last Year: Never true  Transportation Needs: No Transportation Needs (10/14/2021)   PRAPARE - Administrator, Civil Service (Medical): No    Lack of Transportation (Non-Medical): No  Physical Activity: Insufficiently Active (10/14/2021)   Exercise Vital Sign    Days of Exercise per Week: 2 days    Minutes of Exercise per Session: 30 min  Stress: No Stress Concern Present (10/14/2021)   [employer] of Occupational Health - Occupational Stress Questionnaire    Feeling of Stress : Not at all  Social Connections: Moderately Integrated (10/14/2021)   Social Connection and Isolation Panel [NHANES]    Frequency of Communication with Friends and Family: More than three times a week    Frequency of Social Gatherings with Friends and Family: More than three times a week    Attends Religious Services: More than 4 times per year    Active Member of [employer] or Organizations: Yes    Attends Banker Meetings: More than 4 times per year    Marital Status: Divorced  Intimate Partner Violence: Not At Risk (10/14/2021)   Humiliation, Afraid, Rape, and Kick questionnaire    Fear of Current or Ex-Partner: No  Emotionally Abused: No    Physically Abused: No    Sexually Abused: No    Review of Systems: See HPI, otherwise negative ROS  Physical Exam: BP (!) 170/93   Pulse 60   Temp (!) 96.7 F (35.9 C) (Temporal)   Resp 18   SpO2 100%  General:   Alert,  pleasant and cooperative in NAD Head:  Normocephalic and atraumatic. Neck:  Supple; no masses or thyromegaly. Lungs:  Clear throughout to auscultation.    Heart:  Regular rate and rhythm. Abdomen:  Soft, nontender and nondistended. Normal bowel sounds, without guarding, and without rebound.   Neurologic:  Alert and  oriented x4;  grossly normal neurologically.  Impression/Plan: Dave Vasquez is here for an colonoscopy to be performed for colon  cancer screening  Risks, benefits, limitations, and alternatives regarding  colonoscopy have been reviewed with the patient.  Questions have been answered.  All parties agreeable.   Lannette Donath, MD  06/06/2023, 11:39 AM

## 2023-06-06 NOTE — Anesthesia Postprocedure Evaluation (Signed)
Anesthesia Post Note  Patient: Dave Vasquez  Procedure(s) Performed: COLONOSCOPY WITH PROPOFOL  Patient location during evaluation: Endoscopy Anesthesia Type: General Level of consciousness: awake and alert Pain management: pain level controlled Vital Signs Assessment: post-procedure vital signs reviewed and stable Respiratory status: spontaneous breathing, nonlabored ventilation, respiratory function stable and patient connected to nasal cannula oxygen Cardiovascular status: blood pressure returned to baseline and stable Postop Assessment: no apparent nausea or vomiting Anesthetic complications: no   No notable events documented.   Last Vitals:  Vitals:   06/06/23 1205 06/06/23 1215  BP: 112/72 (!) 133/92  Pulse:  61  Resp: 20 18  Temp: (!) 35.7 C   SpO2: 100% 98%    Last Pain:  Vitals:   06/06/23 1215  TempSrc:   PainSc: 0-No pain                 Corinda Gubler

## 2023-06-06 NOTE — Transfer of Care (Signed)
Immediate Anesthesia Transfer of Care Note  Patient: Dave Vasquez  Procedure(s) Performed: COLONOSCOPY WITH PROPOFOL  Patient Location: PACU  Anesthesia Type:General  Level of Consciousness: awake  Airway & Oxygen Therapy: Patient Spontanous Breathing and Patient connected to nasal cannula oxygen  Post-op Assessment: Report given to RN, Post -op Vital signs reviewed and stable, and Patient moving all extremities  Post vital signs: Reviewed and stable  Last Vitals:  Vitals Value Taken Time  BP 112/72 06/06/23 1205  Temp    Pulse    Resp 20 06/06/23 1205  SpO2 100 % 06/06/23 1205    Last Pain:  Vitals:   06/06/23 1205  TempSrc:   PainSc: Asleep         Complications: No notable events documented.

## 2023-06-06 NOTE — Anesthesia Preprocedure Evaluation (Signed)
Anesthesia Evaluation  Patient identified by MRN, date of birth, ID band Patient awake    Reviewed: Allergy & Precautions, NPO status , Patient's Chart, lab work & pertinent test results  History of Anesthesia Complications Negative for: history of anesthetic complications  Airway Mallampati: II  TM Distance: >3 FB Neck ROM: Full    Dental no notable dental hx. (+) Teeth Intact   Pulmonary asthma , neg sleep apnea, neg COPD, Patient abstained from smoking.Not current smoker   Pulmonary exam normal breath sounds clear to auscultation       Cardiovascular Exercise Tolerance: Good METShypertension, Pt. on medications (-) CAD and (-) Past MI (-) dysrhythmias  Rhythm:Regular Rate:Normal - Systolic murmurs    Neuro/Psych negative neurological ROS  negative psych ROS   GI/Hepatic ,neg GERD  ,,(+)     (-) substance abuse    Endo/Other  neg diabetes    Renal/GU negative Renal ROS     Musculoskeletal   Abdominal   Peds  Hematology   Anesthesia Other Findings Past Medical History: No date: Asthma No date: Hypertension  Reproductive/Obstetrics                             Anesthesia Physical Anesthesia Plan  ASA: 2  Anesthesia Plan: General   Post-op Pain Management: Minimal or no pain anticipated   Induction: Intravenous  PONV Risk Score and Plan: 2 and Propofol infusion, TIVA and Ondansetron  Airway Management Planned: Nasal Cannula  Additional Equipment: None  Intra-op Plan:   Post-operative Plan:   Informed Consent: I have reviewed the patients History and Physical, chart, labs and discussed the procedure including the risks, benefits and alternatives for the proposed anesthesia with the patient or authorized representative who has indicated his/her understanding and acceptance.     Dental advisory given  Plan Discussed with: CRNA and Surgeon  Anesthesia Plan Comments:  (Discussed risks of anesthesia with patient, including possibility of difficulty with spontaneous ventilation under anesthesia necessitating airway intervention, PONV, and rare risks such as cardiac or respiratory or neurological events, and allergic reactions. Discussed the role of CRNA in patient's perioperative care. Patient understands.)       Anesthesia Quick Evaluation

## 2023-06-06 NOTE — Op Note (Signed)
[facility] Gastroenterology Patient Name: Dave Vasquez Procedure Date: 06/06/2023 11:38 AM MRN: [id number] Account #: [id number] Date of Birth: 01/25/1966 Admit Type: Outpatient Age: 57 Room: [facility] ENDO ROOM 2 Gender: Male Note Status: Finalized Instrument Name: Nelda Marseille [id number] Procedure:             Colonoscopy Indications:           Screening for colorectal malignant neoplasm, This is                         the patient's first colonoscopy Providers:             Toney Reil MD, MD Referring MD:          Rudolpho Sevin. Zane Herald (Referring MD) Medicines:             General Anesthesia Complications:         No immediate complications. Estimated blood loss: None. Procedure:             Pre-Anesthesia Assessment:                        - Prior to the procedure, a History and Physical was                         performed, and patient medications and allergies were                         reviewed. The patient is competent. The risks and                         benefits of the procedure and the sedation options and                         risks were discussed with the patient. All questions                         were answered and informed consent was obtained.                         Patient identification and proposed procedure were                         verified by the physician, the nurse, the                         anesthesiologist, the anesthetist and the technician                         in the pre-procedure area in the procedure room in the                         endoscopy suite. Mental Status Examination: alert and                         oriented. Airway Examination: normal oropharyngeal                         airway and neck mobility. Respiratory Examination:  clear to auscultation. CV Examination: normal.                         Prophylactic Antibiotics: The patient does not require                         prophylactic  antibiotics. Prior Anticoagulants: The                         patient has taken no anticoagulant or antiplatelet                         agents. ASA Grade Assessment: II - A patient with mild                         systemic disease. After reviewing the risks and                         benefits, the patient was deemed in satisfactory                         condition to undergo the procedure. The anesthesia                         plan was to use general anesthesia. Immediately prior                         to administration of medications, the patient was                         re-assessed for adequacy to receive sedatives. The                         heart rate, respiratory rate, oxygen saturations,                         blood pressure, adequacy of pulmonary ventilation, and                         response to care were monitored throughout the                         procedure. The physical status of the patient was                         re-assessed after the procedure.                        After obtaining informed consent, the colonoscope was                         passed under direct vision. Throughout the procedure,                         the patient's blood pressure, pulse, and oxygen                         saturations were monitored continuously. The  Colonoscope was introduced through the anus and                         advanced to the the cecum, identified by appendiceal                         orifice and ileocecal valve. The colonoscopy was                         performed without difficulty. The patient tolerated                         the procedure well. The quality of the bowel                         preparation was evaluated using the BBPS [facility] Bowel                         Preparation Scale) with scores of: Right Colon = 3,                         Transverse Colon = 3 and Left Colon = 3 (entire mucosa                         seen  well with no residual staining, small fragments                         of stool or opaque liquid). The total BBPS score                         equals 9. The ileocecal valve, appendiceal orifice,                         and rectum were photographed. Findings:      The perianal and digital rectal examinations were normal. Pertinent       negatives include normal sphincter tone and no palpable rectal lesions.      The entire examined colon appeared normal.      The retroflexed view of the distal rectum and anal verge was normal and       showed no anal or rectal abnormalities. Impression:            - The entire examined colon is normal.                        - The distal rectum and anal verge are normal on                         retroflexion view.                        - No specimens collected. Recommendation:        - Discharge patient to home (with escort).                        - Resume previous diet today.                        -  Continue present medications.                        - Repeat colonoscopy in 10 years for screening                         purposes. Procedure Code(s):     --- Professional ---                        F6213, Colorectal cancer screening; colonoscopy on                         individual not meeting criteria for high risk Diagnosis Code(s):     --- Professional ---                        Z12.11, Encounter for screening for malignant neoplasm                         of colon CPT copyright 2022 American Medical Association. All rights reserved. The codes documented in this report are preliminary and upon coder review may  be revised to meet current compliance requirements. Dr. Libby Maw Toney Reil MD, MD 06/06/2023 12:01:10 PM This report has been signed electronically. Number of Addenda: 0 Note Initiated On: 06/06/2023 11:38 AM Scope Withdrawal Time: 0 hours 7 minutes 10 seconds  Total Procedure Duration: 0 hours 10 minutes 7 seconds   Estimated Blood Loss:  Estimated blood loss: none.      [facility]

## 2023-06-07 ENCOUNTER — Encounter: Payer: Self-pay | Admitting: Gastroenterology

## 2023-06-11 ENCOUNTER — Telehealth: Payer: Self-pay | Admitting: Nurse Practitioner

## 2023-06-11 NOTE — Telephone Encounter (Signed)
Copied from CRM [id number]. Topic: Referral - Status >> Jun 11, 2023  9:40 AM Macon Large wrote: Reason for CRM: Pt requests that a referral be sent for Cardiology

## 2023-06-11 NOTE — Telephone Encounter (Signed)
Called pttold him he will need appt

## 2023-06-14 NOTE — Progress Notes (Signed)
BP 136/86 (BP Location: Left Arm, Patient Position: Sitting, Cuff Size: Normal)   Pulse 62   Temp 97.7 F (36.5 C) (Oral)   Resp 16   Ht 5\' 5"  (1.651 m) Comment: per patient  Wt 179 lb 14.4 oz (81.6 kg)   SpO2 99%   BMI 29.94 kg/m    Subjective:    [name], male    DOB: 19-Nov-1965, 57 y.o.   MRN: [id number]  HPI: Dave Vasquez is a 57 y.o. male  Chief Complaint  Patient presents with   Cough    X1 week with production    Discussed the use of AI scribe software for clinical note transcription with the patient, who gave verbal consent to proceed.  History of Present Illness   The patient, with a history of hypertension, presents with a week-long history of cough and shortness of breath. He denies fever and sinus congestion. He has been taking coricidin for his cough and has tried using an albuterol inhaler for his symptoms, but without relief. He has been exposed to walking pneumonia through his daughter. also reports that he would like a referral to cardiology.  He has a history of chest pain and exertional shortness of breath. He reports that when he was having his testing done for asthma in [place] that they told him he needed to see a cardiologist but he never did.       06/15/2023   10:07 AM 05/17/2023   10:44 AM 10/26/2022   10:35 AM  Depression screen PHQ 2/9  Decreased Interest 0 0 0  Down, Depressed, Hopeless 0 0 0  PHQ - 2 Score 0 0 0  Altered sleeping 0    Tired, decreased energy 2    Change in appetite 0    Feeling bad or failure about yourself  0    Trouble concentrating 0    Moving slowly or fidgety/restless 0    Suicidal thoughts 0    PHQ-9 Score 2    Difficult doing work/chores Not difficult at all      Relevant past medical, surgical, family and social history reviewed and updated as indicated. Interim medical history since our last visit reviewed. Allergies and medications reviewed and updated.  Review of Systems  Ten systems  reviewed and is negative except as mentioned in HPI      Objective:    BP 136/86 (BP Location: Left Arm, Patient Position: Sitting, Cuff Size: Normal)   Pulse 62   Temp 97.7 F (36.5 C) (Oral)   Resp 16   Ht 5\' 5"  (1.651 m) Comment: per patient  Wt 179 lb 14.4 oz (81.6 kg)   SpO2 99%   BMI 29.94 kg/m    Wt Readings from Last 3 Encounters:  06/15/23 179 lb 14.4 oz (81.6 kg)  05/17/23 181 lb 9.6 oz (82.4 kg)  05/12/23 176 lb (79.8 kg)    Physical Exam  Constitutional: Patient appears well-developed and well-nourished.  No distress.  HEENT: head atraumatic, normocephalic, pupils equal and reactive to light, neck supple Cardiovascular: Normal rate, regular rhythm and normal heart sounds.  No murmur heard. No BLE edema. Pulmonary/Chest: Effort normal and breath sounds mild wheezing. No respiratory distress. Abdominal: Soft.  There is no tenderness. Psychiatric: Patient has a normal mood and affect. behavior is normal. Judgment and thought content normal.  Results for orders placed or performed during the hospital encounter of 05/12/23  Comprehensive metabolic panel   Collection Time: 05/12/23 11:47  AM  Result Value Ref Range   Sodium 136 135 - 145 mmol/L   Potassium 3.5 3.5 - 5.1 mmol/L   Chloride 101 98 - 111 mmol/L   CO2 30 22 - 32 mmol/L   Glucose, Bld 113 (H) 70 - 99 mg/dL   BUN 16 6 - 20 mg/dL   Creatinine, Ser 1.61 (H) 0.61 - 1.24 mg/dL   Calcium 8.2 (L) 8.9 - 10.3 mg/dL   Total Protein 5.6 (L) 6.5 - 8.1 g/dL   Albumin 3.3 (L) 3.5 - 5.0 g/dL   AST 23 15 - 41 U/L   ALT 19 0 - 44 U/L   Alkaline Phosphatase 49 38 - 126 U/L   Total Bilirubin 0.5 <1.2 mg/dL   GFR, Estimated >09 >60 mL/min   Anion gap 5 5 - 15  CBC   Collection Time: 05/12/23 11:47 AM  Result Value Ref Range   WBC 7.2 4.0 - 10.5 K/uL   RBC 5.02 4.22 - 5.81 MIL/uL   Hemoglobin 14.9 13.0 - 17.0 g/dL   HCT 45.4 09.8 - 11.9 %   MCV 91.4 80.0 - 100.0 fL   MCH 29.7 26.0 - 34.0 pg   MCHC 32.5 30.0 - 36.0  g/dL   RDW 14.7 82.9 - 56.2 %   Platelets 205 150 - 400 K/uL   nRBC 0.0 0.0 - 0.2 %       Assessment & Plan:   Problem List Items Addressed This Visit       Cardiovascular and Mediastinum   Essential hypertension   Relevant Orders   Ambulatory referral to Cardiology     Respiratory   Mild intermittent asthma without complication   Relevant Medications   Fluticasone-Umeclidin-Vilant (TRELEGY ELLIPTA) 100-62.5-25 MCG/ACT AEPB     Other   Mixed hyperlipidemia   Relevant Orders   Ambulatory referral to Cardiology   Other Visit Diagnoses       Acute cough    -  Primary   Relevant Medications   azithromycin (ZITHROMAX) 250 MG tablet   benzonatate (TESSALON) 100 MG capsule   promethazine-dextromethorphan (PROMETHAZINE-DM) 6.25-15 MG/5ML syrup   Fluticasone-Umeclidin-Vilant (TRELEGY ELLIPTA) 100-62.5-25 MCG/ACT AEPB   Other Relevant Orders   DG Chest 2 View     History of chest pain       Relevant Orders   Ambulatory referral to Cardiology     Exertional shortness of breath       Relevant Orders   Ambulatory referral to Cardiology        Assessment and Plan    Respiratory Infection One week of cough and shortness of breath. No fever or sinus congestion. Possible exposure to walking pneumonia.  -Start Azithromycin (Z-Pak) for suspected walking pneumonia. -Order chest x-ray -Start Trelegy inhaler once daily while sick. -Prescribe two cough medications  Cardiology Referral History of chest pain, hypertension, and abnormal findings during a previous asthma study. No known family history of cardiac problems. -Refer to cardiology for further evaluation.       Asthma Currently using albuterol Start trelegy inhaler once daily  Follow up plan: Return if symptoms worsen or fail to improve.

## 2023-06-15 ENCOUNTER — Ambulatory Visit (INDEPENDENT_AMBULATORY_CARE_PROVIDER_SITE_OTHER): Payer: 59 | Admitting: Nurse Practitioner

## 2023-06-15 ENCOUNTER — Encounter: Payer: Self-pay | Admitting: Nurse Practitioner

## 2023-06-15 ENCOUNTER — Ambulatory Visit
Admission: RE | Admit: 2023-06-15 | Discharge: 2023-06-15 | Disposition: A | Discharge status: Home / Self Care | Payer: 59 | Attending: Nurse Practitioner | Admitting: Nurse Practitioner

## 2023-06-15 ENCOUNTER — Ambulatory Visit
Admission: RE | Admit: 2023-06-15 | Discharge: 2023-06-15 | Disposition: A | Discharge status: Home / Self Care | Payer: 59 | Source: Ambulatory Visit | Attending: Nurse Practitioner | Admitting: Nurse Practitioner

## 2023-06-15 VITALS — BP 136/86 | HR 62 | Temp 97.7°F | Resp 16 | Ht 65.0 in | Wt 179.9 lb

## 2023-06-15 DIAGNOSIS — R051 Acute cough: Secondary | ICD-10-CM

## 2023-06-15 DIAGNOSIS — J4520 Mild intermittent asthma, uncomplicated: Secondary | ICD-10-CM | POA: Diagnosis not present

## 2023-06-15 DIAGNOSIS — R0602 Shortness of breath: Secondary | ICD-10-CM | POA: Diagnosis not present

## 2023-06-15 DIAGNOSIS — E782 Mixed hyperlipidemia: Secondary | ICD-10-CM | POA: Diagnosis not present

## 2023-06-15 DIAGNOSIS — Z87898 Personal history of other specified conditions: Secondary | ICD-10-CM | POA: Diagnosis not present

## 2023-06-15 DIAGNOSIS — I10 Essential (primary) hypertension: Secondary | ICD-10-CM | POA: Diagnosis not present

## 2023-06-15 DIAGNOSIS — R059 Cough, unspecified: Secondary | ICD-10-CM | POA: Diagnosis not present

## 2023-06-15 MED ORDER — AZITHROMYCIN 250 MG PO TABS: ORAL_TABLET | ORAL | 0 refills | Status: AC

## 2023-06-15 MED ORDER — TRELEGY ELLIPTA 100-62.5-25 MCG/ACT IN AEPB: 1.0000 | INHALATION_SPRAY | Freq: Every day | RESPIRATORY_TRACT | Status: AC

## 2023-06-15 MED ORDER — BENZONATATE 100 MG PO CAPS: 200.0000 mg | ORAL_CAPSULE | Freq: Two times a day (BID) | ORAL | 0 refills | Status: DC | PRN

## 2023-06-15 MED ORDER — PROMETHAZINE-DM 6.25-15 MG/5ML PO SYRP: 5.0000 mL | ORAL_SOLUTION | Freq: Four times a day (QID) | ORAL | 0 refills | Status: DC | PRN

## 2023-06-20 ENCOUNTER — Encounter: Payer: Self-pay | Admitting: Nurse Practitioner

## 2023-08-24 ENCOUNTER — Ambulatory Visit: Payer: Medicaid Other | Admitting: Cardiology

## 2023-08-26 DIAGNOSIS — Z87898 Personal history of other specified conditions: Secondary | ICD-10-CM | POA: Insufficient documentation

## 2023-08-26 NOTE — Progress Notes (Unsigned)
 Cardiology Office Note  Date:  08/28/2023   ID:  Dave Vasquez, Dave Vasquez 06-06-1966, MRN [id number]  PCP:  Berniece Salines, FNP   Chief Complaint  Patient presents with   New Patient (Initial Visit)    Ref by Della Goo, FNP for a history of chest pain and HTN. Patient c/o bilateral leg pain and cramping with walking, racing heart beats at times and has shortness of breath with over exertion. Patient had Covid in 2020, has noticed difficulty breathing since.     HPI:  Mr. Dave Vasquez is a 58 year old gentleman with past medical history of Hypertension Asthma Chronic kidney disease Hyperlipidemia who presents by referral from Della Goo for consultation of his history of chest pain, hypertension  Reports long history of chronic shortness of breath and chest tightness  Asthma since child, uses inhalers as needed Was going to get enrolled in asthma study when he was younger, was told at that time to see cardiology for unclear reasons  Reports having chronic SOB, chest pain on exertion  Also troubled by pain in legs, sharp pain, fleeting, feels like muscle cramping Trying to stay hydrated,  Leg pain started before lipitor  Non smoker, no alcohol  EKG personally reviewed by myself on todays visit EKG Interpretation Date/Time:  Tuesday August 28 2023 09:10:20 EST Ventricular Rate:  66 PR Interval:  158 QRS Duration:  92 QT Interval:  388 QTC Calculation: 406 R Axis:   46  Text Interpretation: Sinus rhythm with Premature atrial complexes Minimal voltage criteria for LVH, may be normal variant ( Sokolow-Lyon ) Nonspecific T wave abnormality When compared with ECG of 24-Nov-2016 11:42, Premature atrial complexes are now Present Nonspecific T wave abnormality has replaced inverted T waves in Inferior leads Confirmed by Julien Nordmann [phone]) on 08/28/2023 9:26:01 AM     PMH:   has a past medical history of Asthma and Hypertension.  PSH:    Past Surgical History:  Procedure  Laterality Date   COLONOSCOPY WITH PROPOFOL N/A 06/06/2023   Procedure: COLONOSCOPY WITH PROPOFOL;  Surgeon: Toney Reil, MD;  Location: [facility] ENDOSCOPY;  Service: Gastroenterology;  Laterality: N/A;    Current Outpatient Medications  Medication Sig Dispense Refill   albuterol (VENTOLIN HFA) 108 (90 Base) MCG/ACT inhaler 2 puffs q.i.d. p.r.n. short of breath, wheezing, or cough 18 g 3   amLODipine (NORVASC) 5 MG tablet Take 1 tablet (5 mg total) by mouth daily. 90 tablet 3   atorvastatin (LIPITOR) 10 MG tablet Take 1 tablet (10 mg total) by mouth daily. 90 tablet 3   Fluticasone-Umeclidin-Vilant (TRELEGY ELLIPTA) 100-62.5-25 MCG/ACT AEPB Inhale 1 puff into the lungs daily.     No current facility-administered medications for this visit.    Allergies:   No known allergies   Social History:  The patient  reports that he has never smoked. He has never used smokeless tobacco. He reports that he does not drink alcohol and does not use drugs.   Family History:   family history includes Deep vein thrombosis in his cousin; Dementia in his mother; Diabetes in his mother; Hypertension in his mother.    Review of Systems: Review of Systems  Constitutional: Negative.   HENT: Negative.    Respiratory: Negative.    Cardiovascular: Negative.   Gastrointestinal: Negative.   Musculoskeletal: Negative.   Neurological: Negative.   Psychiatric/Behavioral: Negative.    All other systems reviewed and are negative.    PHYSICAL EXAM: VS:  BP 130/80 (BP Location: Right Arm,  Patient Position: Sitting, Cuff Size: Normal)   Pulse 66   Ht 5\' 5"  (1.651 m)   Wt 180 lb (81.6 kg)   SpO2 98%   BMI 29.95 kg/m  , BMI Body mass index is 29.95 kg/m. GEN: Well nourished, well developed, in no acute distress HEENT: normal Neck: no JVD, carotid bruits, or masses Cardiac: RRR; no murmurs, rubs, or gallops,no edema  Respiratory:  clear to auscultation bilaterally, normal work of breathing GI: soft,  nontender, nondistended, + BS MS: no deformity or atrophy Skin: warm and dry, no rash Neuro:  Strength and sensation are intact Psych: euthymic mood, full affect   Recent Labs: 05/12/2023: ALT 19; BUN 16; Creatinine, Ser 1.30; Hemoglobin 14.9; Platelets 205; Potassium 3.5; Sodium 136    Lipid Panel Lab Results  Component Value Date   CHOL 162 10/26/2022   HDL 34 (L) 10/26/2022   LDLCALC 100 (H) 10/26/2022   TRIG 186 (H) 10/26/2022      Wt Readings from Last 3 Encounters:  08/28/23 180 lb (81.6 kg)  06/15/23 179 lb 14.4 oz (81.6 kg)  05/17/23 181 lb 9.6 oz (82.4 kg)       ASSESSMENT AND PLAN:  Problem List Items Addressed This Visit       Cardiology Problems   Mixed hyperlipidemia     Other   History of chest pain - Primary   Relevant Orders   EKG 12-Lead (Completed)   Mild intermittent asthma without complication   Relevant Orders   EKG 12-Lead (Completed)   Shortness of breath Possibly secondary to asthma, unable to exclude cardiac etiology Echo cardiogram ordered to rule out structural heart disease, evaluate for LVH, cardiomyopathy  Chest pain Typical and atypical features Recommended treadmill Myoview to rule out ischemia, lexiscan could be used if needed but he does have mild asthma history  Hyperlipidemia Tolerating Lipitor He does not feel the Lipitor is contributing to his leg cramps  Leg cramping Etiology unclear, recommend he stay hydrated, try magnesium, potassium  Chronic renal insufficiency Creatinine running 1.3 up to 1.4 over the past several years Recommend he stay hydrated, avoid NSAIDs   Signed, Dossie Arbour, M.D., Ph.D. [facility] [place], [place] [phone]

## 2023-08-28 ENCOUNTER — Encounter: Payer: Self-pay | Admitting: Cardiovascular Disease

## 2023-08-28 ENCOUNTER — Ambulatory Visit: Payer: Medicaid Other | Attending: Cardiovascular Disease | Admitting: Cardiovascular Disease

## 2023-08-28 VITALS — BP 130/80 | HR 66 | Ht 65.0 in | Wt 180.0 lb

## 2023-08-28 DIAGNOSIS — R0600 Dyspnea, unspecified: Secondary | ICD-10-CM

## 2023-08-28 DIAGNOSIS — R072 Precordial pain: Secondary | ICD-10-CM

## 2023-08-28 DIAGNOSIS — Z87898 Personal history of other specified conditions: Secondary | ICD-10-CM

## 2023-08-28 DIAGNOSIS — J4520 Mild intermittent asthma, uncomplicated: Secondary | ICD-10-CM

## 2023-08-28 DIAGNOSIS — E782 Mixed hyperlipidemia: Secondary | ICD-10-CM

## 2023-08-28 NOTE — Patient Instructions (Addendum)
 Medication Instructions:  No changes  If you need a refill on your cardiac medications before your next appointment, please call your pharmacy.   Lab work: No new labs needed  Testing/Procedures:  Your physician has requested that you have an echocardiogram. Echocardiography is a painless test that uses sound waves to create images of your heart. It provides your doctor with information about the size and shape of your heart and how well your heart's chambers and valves are working.   You may receive an ultrasound enhancing agent through an IV if needed to better visualize your heart during the echo. This procedure takes approximately one hour.  There are no restrictions for this procedure.  This will take place at [address] [facility] [address], [place] [address]  Please note: We ask at that you not bring children with you during ultrasound (echo/ vascular) testing. Due to room size and safety concerns, children are not allowed in the ultrasound rooms during exams. Our front office staff cannot provide observation of children in our lobby area while testing is being conducted. An adult accompanying a patient to their appointment will only be allowed in the ultrasound room at the discretion of the ultrasound technician under special circumstances. We apologize for any inconvenience.   Your provider has ordered a Lexiscan/ Exercise Myoview Stress test. This will take place at [facility] Please report to the [facility] medical mall entrance. The volunteers at the first desk will direct you where to go.  [facility] MYOVIEW  Your provider has ordered a Stress Test with nuclear imaging. The purpose of this test is to evaluate the blood supply to your heart muscle. This procedure is referred to as a "Non-Invasive Stress Test." This is because other than having an IV started in your vein, nothing is inserted or "invades" your body. Cardiac stress tests are done to find areas of poor blood flow to the  heart by determining the extent of coronary artery disease (CAD). Some patients exercise on a treadmill, which naturally increases the blood flow to your heart, while others who are unable to walk on a treadmill due to physical limitations will have a pharmacologic/chemical stress agent called Lexiscan . This medicine will mimic walking on a treadmill by temporarily increasing your coronary blood flow.   Please note: these test may take anywhere between 2-4 hours to complete  How to prepare for your Myoview test:  Nothing to eat for 6 hours prior to the test No caffeine for 24 hours prior to test No smoking 24 hours prior to test. Your medication may be taken with water.  If your doctor stopped a medication because of this test, do not take that medication. Ladies, please do not wear dresses.  Skirts or pants are appropriate. Please wear a short sleeve shirt. No perfume, cologne or lotion. Wear comfortable walking shoes. No heels!   PLEASE NOTIFY THE OFFICE AT LEAST 24 HOURS IN ADVANCE IF YOU ARE UNABLE TO KEEP YOUR APPOINTMENT.  [phone] AND  PLEASE NOTIFY NUCLEAR MEDICINE AT [facility] AT LEAST 24 HOURS IN ADVANCE IF YOU ARE UNABLE TO KEEP YOUR APPOINTMENT. [phone]   Follow-Up: At [facility], you and your health needs are our priority.  As part of our continuing mission to provide you with exceptional heart care, we have created designated Provider Care Teams.  These Care Teams include your primary Cardiologist (physician) and Advanced Practice Providers (APPs -  Physician Assistants and Nurse Practitioners) who all work together to provide you  with the care you need, when you need it.  You will need a follow up appointment as needed  Providers on your designated Care Team:   Nicolasa Ducking, NP Eula Listen, PA-C Cadence Fransico Michael, [place]  COVID-19 Vaccine Information can be found at: [email or web address] For questions related  to vaccine distribution or appointments, please email [email or web address] or call [phone].

## 2023-09-06 ENCOUNTER — Encounter
Admission: RE | Admit: 2023-09-06 | Discharge: 2023-09-06 | Disposition: A | Discharge status: Home / Self Care | Payer: Medicaid Other | Source: Ambulatory Visit | Attending: Cardiovascular Disease | Admitting: Cardiovascular Disease

## 2023-09-06 DIAGNOSIS — Z87898 Personal history of other specified conditions: Secondary | ICD-10-CM | POA: Insufficient documentation

## 2023-09-06 DIAGNOSIS — R072 Precordial pain: Secondary | ICD-10-CM | POA: Insufficient documentation

## 2023-09-06 LAB — NM MYOCAR MULTI W/SPECT W/WALL MOTION / EF
Angina Index: 1
LV dias vol: 119 mL (ref 62–150)
LV sys vol: 55 mL
MPHR: 165 {beats}/min
Nuc Stress EF: 54 %
Peak HR: 157 {beats}/min
Percent HR: 95 %
Rest HR: 62 {beats}/min
Rest Nuclear Isotope Dose: 10.4 mCi
SDS: 0
SRS: 10
SSS: 0
Stress Nuclear Isotope Dose: 32.4 mCi
TID: 0.91

## 2023-09-06 MED ORDER — TECHNETIUM TC 99M TETROFOSMIN IV KIT
32.3700 | PACK | Freq: Once | INTRAVENOUS | Status: AC | PRN
  Administered 2023-09-06: 32.37 via INTRAVENOUS

## 2023-09-06 MED ORDER — TECHNETIUM TC 99M TETROFOSMIN IV KIT
10.4000 | PACK | Freq: Once | INTRAVENOUS | Status: AC | PRN
  Administered 2023-09-06: 10.4 via INTRAVENOUS

## 2023-09-07 ENCOUNTER — Encounter: Payer: Self-pay | Admitting: Cardiovascular Disease

## 2023-09-26 DIAGNOSIS — H5213 Myopia, bilateral: Secondary | ICD-10-CM | POA: Diagnosis not present

## 2023-09-26 DIAGNOSIS — H52223 Regular astigmatism, bilateral: Secondary | ICD-10-CM | POA: Diagnosis not present

## 2023-09-27 ENCOUNTER — Ambulatory Visit: Payer: Medicaid Other | Attending: Cardiovascular Disease

## 2023-09-27 DIAGNOSIS — R0600 Dyspnea, unspecified: Secondary | ICD-10-CM

## 2023-09-27 LAB — ECHOCARDIOGRAM COMPLETE
AR max vel: 2.69 cm2
AV Area VTI: 2.7 cm2
AV Area mean vel: 2.51 cm2
AV Mean grad: 4 mmHg
AV Peak grad: 7.6 mmHg
Ao pk vel: 1.38 m/s
Area-P 1/2: 3.27 cm2
S' Lateral: 2.9 cm

## 2023-09-28 ENCOUNTER — Encounter: Payer: Self-pay | Admitting: Emergency Medicine

## 2023-11-14 ENCOUNTER — Ambulatory Visit: Payer: 59 | Admitting: Nurse Practitioner

## 2023-12-28 DIAGNOSIS — Z125 Encounter for screening for malignant neoplasm of prostate: Secondary | ICD-10-CM | POA: Diagnosis not present

## 2023-12-28 DIAGNOSIS — R7309 Other abnormal glucose: Secondary | ICD-10-CM | POA: Diagnosis not present

## 2023-12-28 DIAGNOSIS — Z0000 Encounter for general adult medical examination without abnormal findings: Secondary | ICD-10-CM | POA: Diagnosis not present

## 2023-12-28 DIAGNOSIS — Z1331 Encounter for screening for depression: Secondary | ICD-10-CM | POA: Diagnosis not present

## 2023-12-28 DIAGNOSIS — E782 Mixed hyperlipidemia: Secondary | ICD-10-CM | POA: Diagnosis not present

## 2023-12-28 DIAGNOSIS — I10 Essential (primary) hypertension: Secondary | ICD-10-CM | POA: Diagnosis not present

## 2023-12-28 DIAGNOSIS — J4520 Mild intermittent asthma, uncomplicated: Secondary | ICD-10-CM | POA: Diagnosis not present

## 2023-12-28 DIAGNOSIS — Z79899 Other long term (current) drug therapy: Secondary | ICD-10-CM | POA: Diagnosis not present

## 2024-01-01 DIAGNOSIS — Z125 Encounter for screening for malignant neoplasm of prostate: Secondary | ICD-10-CM | POA: Diagnosis not present

## 2024-01-01 DIAGNOSIS — Z79899 Other long term (current) drug therapy: Secondary | ICD-10-CM | POA: Diagnosis not present

## 2024-01-01 DIAGNOSIS — R7309 Other abnormal glucose: Secondary | ICD-10-CM | POA: Diagnosis not present

## 2024-01-01 DIAGNOSIS — E782 Mixed hyperlipidemia: Secondary | ICD-10-CM | POA: Diagnosis not present

## 2024-01-21 DIAGNOSIS — M5416 Radiculopathy, lumbar region: Secondary | ICD-10-CM | POA: Diagnosis not present

## 2024-01-21 DIAGNOSIS — M160 Bilateral primary osteoarthritis of hip: Secondary | ICD-10-CM | POA: Diagnosis not present

## 2024-02-02 DIAGNOSIS — R52 Pain, unspecified: Secondary | ICD-10-CM | POA: Diagnosis not present

## 2024-02-02 DIAGNOSIS — M791 Myalgia, unspecified site: Secondary | ICD-10-CM | POA: Diagnosis not present

## 2024-02-02 DIAGNOSIS — R051 Acute cough: Secondary | ICD-10-CM | POA: Diagnosis not present

## 2024-02-02 DIAGNOSIS — U071 COVID-19: Secondary | ICD-10-CM | POA: Diagnosis not present

## 2024-04-23 DIAGNOSIS — M1612 Unilateral primary osteoarthritis, left hip: Secondary | ICD-10-CM | POA: Diagnosis not present

## 2024-05-14 DIAGNOSIS — M1612 Unilateral primary osteoarthritis, left hip: Secondary | ICD-10-CM | POA: Diagnosis not present

## 2024-05-14 DIAGNOSIS — M5416 Radiculopathy, lumbar region: Secondary | ICD-10-CM | POA: Diagnosis not present

## 2024-05-16 ENCOUNTER — Other Ambulatory Visit: Payer: Self-pay | Admitting: Family Medicine

## 2024-05-16 DIAGNOSIS — M5416 Radiculopathy, lumbar region: Secondary | ICD-10-CM

## 2024-05-24 ENCOUNTER — Ambulatory Visit
Admission: RE | Admit: 2024-05-24 | Discharge: 2024-05-24 | Disposition: A | Discharge status: Home / Self Care | Source: Ambulatory Visit | Attending: Family Medicine | Admitting: Family Medicine

## 2024-05-24 DIAGNOSIS — M5416 Radiculopathy, lumbar region: Secondary | ICD-10-CM
